# Patient Record
Sex: Male | Born: 1960 | Race: White | Hispanic: No | Marital: Married | Smoking: Current every day smoker
Health system: Southern US, Community
[De-identification: ages and names within clinical notes are randomized; demographics above are authoritative.]

## PROBLEM LIST (undated history)

## (undated) DIAGNOSIS — M549 Dorsalgia, unspecified: Secondary | ICD-10-CM

## (undated) DIAGNOSIS — B1920 Unspecified viral hepatitis C without hepatic coma: Secondary | ICD-10-CM

## (undated) DIAGNOSIS — H8109 Meniere's disease, unspecified ear: Secondary | ICD-10-CM

## (undated) HISTORY — DX: Unspecified viral hepatitis C without hepatic coma: B19.20

## (undated) HISTORY — PX: HERNIA REPAIR: SHX51

## (undated) HISTORY — PX: EYE SURGERY: SHX253

---

## 2010-12-13 ENCOUNTER — Inpatient Hospital Stay (INDEPENDENT_AMBULATORY_CARE_PROVIDER_SITE_OTHER)
Admission: RE | Admit: 2010-12-13 | Discharge: 2010-12-13 | Disposition: A | Discharge status: Home / Self Care | Payer: BC Managed Care – PPO | Source: Ambulatory Visit | Attending: Family Medicine | Admitting: Family Medicine

## 2010-12-13 DIAGNOSIS — H8109 Meniere's disease, unspecified ear: Secondary | ICD-10-CM

## 2012-07-31 ENCOUNTER — Encounter (HOSPITAL_COMMUNITY): Payer: Self-pay | Admitting: *Deleted

## 2012-07-31 ENCOUNTER — Emergency Department (HOSPITAL_COMMUNITY): Payer: BC Managed Care – PPO

## 2012-07-31 ENCOUNTER — Observation Stay (HOSPITAL_COMMUNITY)
Admission: EM | Admit: 2012-07-31 | Discharge: 2012-08-01 | Disposition: A | Discharge status: Home / Self Care | Payer: BC Managed Care – PPO | Attending: Internal Medicine | Admitting: Internal Medicine

## 2012-07-31 DIAGNOSIS — R05 Cough: Secondary | ICD-10-CM | POA: Insufficient documentation

## 2012-07-31 DIAGNOSIS — J3489 Other specified disorders of nose and nasal sinuses: Secondary | ICD-10-CM | POA: Insufficient documentation

## 2012-07-31 DIAGNOSIS — M549 Dorsalgia, unspecified: Secondary | ICD-10-CM | POA: Insufficient documentation

## 2012-07-31 DIAGNOSIS — H8109 Meniere's disease, unspecified ear: Secondary | ICD-10-CM | POA: Insufficient documentation

## 2012-07-31 DIAGNOSIS — R110 Nausea: Secondary | ICD-10-CM | POA: Insufficient documentation

## 2012-07-31 DIAGNOSIS — J441 Chronic obstructive pulmonary disease with (acute) exacerbation: Secondary | ICD-10-CM | POA: Insufficient documentation

## 2012-07-31 DIAGNOSIS — R059 Cough, unspecified: Secondary | ICD-10-CM | POA: Insufficient documentation

## 2012-07-31 DIAGNOSIS — R0789 Other chest pain: Principal | ICD-10-CM | POA: Insufficient documentation

## 2012-07-31 DIAGNOSIS — F17200 Nicotine dependence, unspecified, uncomplicated: Secondary | ICD-10-CM | POA: Insufficient documentation

## 2012-07-31 DIAGNOSIS — Z720 Tobacco use: Secondary | ICD-10-CM

## 2012-07-31 DIAGNOSIS — Z79899 Other long term (current) drug therapy: Secondary | ICD-10-CM | POA: Insufficient documentation

## 2012-07-31 DIAGNOSIS — R209 Unspecified disturbances of skin sensation: Secondary | ICD-10-CM | POA: Insufficient documentation

## 2012-07-31 HISTORY — DX: Dorsalgia, unspecified: M54.9

## 2012-07-31 HISTORY — DX: Meniere's disease, unspecified ear: H81.09

## 2012-07-31 LAB — CBC
Hemoglobin: 15.9 g/dL (ref 13.0–17.0)
MCH: 26.6 pg (ref 26.0–34.0)
MCHC: 33.7 g/dL (ref 30.0–36.0)
Platelets: 186 10*3/uL (ref 150–400)
Platelets: 179 10*3/uL (ref 150–400)
RBC: 5.98 MIL/uL — ABNORMAL HIGH (ref 4.22–5.81)
RBC: 5.63 MIL/uL (ref 4.22–5.81)
RDW: 13.3 % (ref 11.5–15.5)
WBC: 7.7 10*3/uL (ref 4.0–10.5)

## 2012-07-31 LAB — COMPREHENSIVE METABOLIC PANEL
ALT: 42 U/L (ref 0–53)
AST: 43 U/L — ABNORMAL HIGH (ref 0–37)
Alkaline Phosphatase: 61 U/L (ref 39–117)
CO2: 23 mEq/L (ref 19–32)
Calcium: 9.5 mg/dL (ref 8.4–10.5)
GFR calc Af Amer: >90 mL/min (ref >90)
Glucose, Bld: 155 mg/dL — ABNORMAL HIGH (ref 70–99)
Potassium: 3.4 mEq/L — ABNORMAL LOW (ref 3.5–5.1)
Sodium: 134 mEq/L — ABNORMAL LOW (ref 135–145)
Total Protein: 7.5 g/dL (ref 6.0–8.3)

## 2012-07-31 LAB — POCT I-STAT TROPONIN I

## 2012-07-31 LAB — HEPATIC FUNCTION PANEL
ALT: 36 U/L (ref 0–53)
AST: 35 U/L (ref 0–37)
Total Protein: 6.6 g/dL (ref 6.0–8.3)

## 2012-07-31 LAB — TROPONIN I: Troponin I: <0.3 ng/mL (ref <0.30)

## 2012-07-31 LAB — LIPID PANEL
LDL Cholesterol: 86 mg/dL (ref 0–99)
VLDL: 27 mg/dL (ref 0–40)

## 2012-07-31 LAB — PHOSPHORUS: Phosphorus: 2.9 mg/dL (ref 2.3–4.6)

## 2012-07-31 LAB — CK TOTAL AND CKMB (NOT AT ARMC)
CK, MB: 2 ng/mL (ref 0.3–4.0)
Relative Index: INVALID (ref 0.0–2.5)

## 2012-07-31 LAB — MAGNESIUM: Magnesium: 2.1 mg/dL (ref 1.5–2.5)

## 2012-07-31 MED ORDER — ESCITALOPRAM OXALATE 20 MG PO TABS
20.0000 mg | ORAL_TABLET | Freq: Every evening | ORAL | Status: DC
Start: 2012-07-31 — End: 2012-08-01
  Administered 2012-07-31: 20 mg via ORAL
  Filled 2012-07-31 (×2): qty 1

## 2012-07-31 MED ORDER — MORPHINE SULFATE 2 MG/ML IJ SOLN: 1.0000 mg | INTRAMUSCULAR | Status: DC | PRN

## 2012-07-31 MED ORDER — HYDROMORPHONE HCL PF 1 MG/ML IJ SOLN: 0.5000 mg | INTRAMUSCULAR | Status: AC | PRN

## 2012-07-31 MED ORDER — GI COCKTAIL ~~LOC~~
30.0000 mL | Freq: Once | ORAL | Status: AC
  Administered 2012-07-31: 30 mL via ORAL
  Filled 2012-07-31: qty 30

## 2012-07-31 MED ORDER — SODIUM CHLORIDE 0.9 % IV BOLUS (SEPSIS)
1000.0000 mL | Freq: Once | INTRAVENOUS | Status: AC
  Administered 2012-07-31: 1000 mL via INTRAVENOUS

## 2012-07-31 MED ORDER — SODIUM CHLORIDE 0.9 % IJ SOLN
3.0000 mL | Freq: Two times a day (BID) | INTRAMUSCULAR | Status: DC
  Administered 2012-07-31: 3 mL via INTRAVENOUS

## 2012-07-31 MED ORDER — SODIUM CHLORIDE 0.9 % IV SOLN: INTRAVENOUS | Status: DC

## 2012-07-31 MED ORDER — LEVOFLOXACIN 750 MG PO TABS
750.0000 mg | ORAL_TABLET | Freq: Every day | ORAL | Status: DC
  Administered 2012-07-31 – 2012-08-01 (×2): 750 mg via ORAL
  Filled 2012-07-31 (×2): qty 1

## 2012-07-31 MED ORDER — ALUM & MAG HYDROXIDE-SIMETH 200-200-20 MG/5ML PO SUSP: 30.0000 mL | Freq: Four times a day (QID) | ORAL | Status: DC | PRN

## 2012-07-31 MED ORDER — ACETAMINOPHEN 650 MG RE SUPP: 650.0000 mg | Freq: Four times a day (QID) | RECTAL | Status: DC | PRN

## 2012-07-31 MED ORDER — ENOXAPARIN SODIUM 40 MG/0.4ML ~~LOC~~ SOLN
40.0000 mg | SUBCUTANEOUS | Status: DC
  Administered 2012-07-31: 40 mg via SUBCUTANEOUS
  Filled 2012-07-31 (×2): qty 0.4

## 2012-07-31 MED ORDER — ASPIRIN EC 325 MG PO TBEC
325.0000 mg | DELAYED_RELEASE_TABLET | Freq: Every day | ORAL | Status: DC
  Administered 2012-07-31 – 2012-08-01 (×2): 325 mg via ORAL
  Filled 2012-07-31 (×2): qty 1

## 2012-07-31 MED ORDER — HYDROCODONE-ACETAMINOPHEN 5-325 MG PO TABS: 1.0000 | ORAL_TABLET | ORAL | Status: DC | PRN

## 2012-07-31 MED ORDER — ONDANSETRON HCL 4 MG/2ML IJ SOLN: 4.0000 mg | Freq: Four times a day (QID) | INTRAMUSCULAR | Status: DC | PRN

## 2012-07-31 MED ORDER — ONDANSETRON HCL 4 MG/2ML IJ SOLN: 4.0000 mg | Freq: Three times a day (TID) | INTRAMUSCULAR | Status: DC | PRN

## 2012-07-31 MED ORDER — ONDANSETRON HCL 4 MG PO TABS: 4.0000 mg | ORAL_TABLET | Freq: Four times a day (QID) | ORAL | Status: DC | PRN

## 2012-07-31 MED ORDER — SENNA 8.6 MG PO TABS
1.0000 | ORAL_TABLET | Freq: Two times a day (BID) | ORAL | Status: DC
  Administered 2012-07-31 (×2): 8.6 mg via ORAL
  Filled 2012-07-31: qty 2
  Filled 2012-07-31 (×2): qty 1

## 2012-07-31 MED ORDER — ASPIRIN 81 MG PO CHEW
324.0000 mg | CHEWABLE_TABLET | Freq: Once | ORAL | Status: AC
  Administered 2012-07-31: 162 mg via ORAL
  Filled 2012-07-31: qty 2

## 2012-07-31 MED ORDER — IPRATROPIUM BROMIDE 0.02 % IN SOLN: 0.5000 mg | RESPIRATORY_TRACT | Status: DC | PRN

## 2012-07-31 MED ORDER — CLONAZEPAM 0.5 MG PO TABS: 0.5000 mg | ORAL_TABLET | Freq: Three times a day (TID) | ORAL | Status: DC | PRN

## 2012-07-31 MED ORDER — ALBUTEROL SULFATE (5 MG/ML) 0.5% IN NEBU: 2.5000 mg | INHALATION_SOLUTION | RESPIRATORY_TRACT | Status: DC | PRN

## 2012-07-31 MED ORDER — BISACODYL 5 MG PO TBEC: 5.0000 mg | DELAYED_RELEASE_TABLET | Freq: Every day | ORAL | Status: DC | PRN

## 2012-07-31 MED ORDER — NITROGLYCERIN 0.4 MG SL SUBL: 0.4000 mg | SUBLINGUAL_TABLET | SUBLINGUAL | Status: DC | PRN

## 2012-07-31 MED ORDER — ACETAMINOPHEN 325 MG PO TABS: 650.0000 mg | ORAL_TABLET | Freq: Four times a day (QID) | ORAL | Status: DC | PRN

## 2012-07-31 NOTE — ED Notes (Signed)
Pt reports epigastric pain since this am, describes as pressure. Reports diaphoresis, dizziness, sob. Denies n/v. Cough/sick since Tuesday.

## 2012-07-31 NOTE — H&P (Signed)
[facility] History and Physical  Andrew Campos YNW:[id number] DOB: 04-28-1961 DOA: 07/31/2012  Referring physician: Jeraldine Loots PCP: No primary provider on file.   Chief Complaint: chest pain/bilateral hand numbness  HPI: Ishaq Maffei is a 51 y.o. male  51 yo hx chronic back pain, meneires's disease presents to ED cc chest pain, bilateral hand and feet numbness. Reports having "flu-like" symptoms earlier in week and did not work . During that time reports nasal/sinus congestion, cough, nausea no vomiting, no diarrhea. Also decreased po intake. On way to work this am hands suddenly became numb and developed CP. Describes pain as pressure and points to epigastric area. Denies sob, diaphoresis. Denied radiation of the pain.  Reports pain constant until he received meds in ED. He got dilaudid and GI cocktail and zofran. Rates pain 9/10 at worst, nothing made better, nothing made worse. Denies visual disturbances, dizziness. Pt with hx smoking and family hx CAD with MI. Pt is smoker. [facility] asked to admit for CP rule out.   Review of Systems: The patient denies  fever, weight loss,, vision loss, decreased hearing, hoarseness, syncope, dyspnea on exertion, peripheral edema, balance deficits, hemoptysis, abdominal pain, melena, hematochezia, severe indigestion/heartburn, hematuria, incontinence, genital sores, muscle weakness, suspicious skin lesions, transient blindness, difficulty walking,  unusual weight change, abnormal bleeding, enlarged lymph nodes, angioedema, and breast masses.    Past Medical History  Diagnosis Date  . Meniere's disease   . Back pain    Past Surgical History  Procedure Date  . Hernia repair   . Eye surgery    Social History:  reports that he has been smoking.  He has never used smokeless tobacco. He reports that he drinks alcohol. He reports that he does not use illicit drugs. Pt lives with spouse. Is independent with ADL's  Allergies  Allergen Reactions   . Penicillins Hives    History reviewed. No pertinent family history. Grandfather died of MI. Father alive with CAD and 1 MI. One brother with schizophrenia other healthy. Unsure of mothers medical hx   Prior to Admission medications   Medication Sig Start Date End Date Taking? Authorizing Provider  clonazePAM (KLONOPIN) 0.5 MG tablet Take 0.5 mg by mouth 4 (four) times daily as needed. For anxiety   Yes Historical Provider, MD  escitalopram (LEXAPRO) 20 MG tablet Take 20 mg by mouth every evening.   Yes Historical Provider, MD   Physical Exam: Filed Vitals:   07/31/12 0838  BP: 126/77  Pulse: 68  Temp: 97.9 F (36.6 C)  TempSrc: Oral  Resp: 20  SpO2: 100%     General:  Awake, alert, NAD  Eyes: PERRL, EOMI  ENT: mucus membranes pink/moist, ears clear, no drainage from nose.   Neck: supple, full rom, no JVD  Cardiovascular: RRR no MGR, No LEE  Respiratory: normal effort, BSCTAB No wheeze, rhonchi dry cough  Abdomen: soft, +BS mild tenderness in upper quadrants. No mass/organmegaly  Skin: warm/dry, no rash/lesion  Musculoskeletal: MOE no Joint swelling/erythema  Psychiatric: appropriate, cooperative  Neurologic: cranial nerve II-XII intact. Sensation intact UE bilaterally   Labs on Admission:  Basic Metabolic Panel:  Lab 07/31/12 6578  NA 134*  K 3.4*  CL 99  CO2 23  GLUCOSE 155*  BUN 12  CREATININE 0.80  CALCIUM 9.5  MG --  PHOS --   Liver Function Tests:  Lab 07/31/12 0900  AST 43*  ALT 42  ALKPHOS 61  BILITOT 0.9  PROT 7.5  ALBUMIN 4.0  Lab 07/31/12 0900  LIPASE 23  AMYLASE --   No results found for this basename: AMMONIA:5 in the last 168 hours CBC:  Lab 07/31/12 0900  WBC 9.5  NEUTROABS --  HGB 15.9  HCT 47.2  MCV 78.9  PLT 186   Cardiac Enzymes: No results found for this basename: CKTOTAL:5,CKMB:5,CKMBINDEX:5,TROPONINI:5 in the last 168 hours  BNP (last 3 results) No results found for this basename: PROBNP:3 in the last  8760 hours CBG: No results found for this basename: GLUCAP:5 in the last 168 hours  Radiological Exams on Admission: Dg Chest Port 1 View  07/31/2012  *RADIOLOGY REPORT*  Clinical Data: Chest pain and pressure, numbness and tingling in both upper extremities, history smoking  PORTABLE CHEST - 1 VIEW  Comparison: Portable exam 0854 hours without priors for comparison.  Findings: Normal heart size, mediastinal contours, and pulmonary vascularity. Lungs clear. No pleural effusion or pneumothorax.  IMPRESSION: No acute abnormalities.   Original Report Authenticated By: Lollie Marrow, M.D.     EKG: Independently reviewed. NSR  Assessment/Plan Principal Problem:  *Chest pain, atypical: given smoking hx and family hx and pt never had eval will admit to tele for rule out. Currently much improved.  Will cycle CE, will check FLP, liver panel, TSH. Will repeat EKG in am. Will provide NTG and Morphine as needed for CP. Will provide anti-emetics as needed. Test results and clinical course to determine if needs stress test and if needs to be inpt vs OP.  Active Problems:  constipation: pt reports no BM in several days. Will provide dulcolax Hypokalemia: mild, likely related to decreased po intake. Will replete. Provide gentle IV hydration Anxiety: at baseline will continue home meds  Code Status: full Family Communication: pt at bedside Disposition Plan: home when medically stable.   Time spent: 45 minutes  Gwenyth Bender  NP [facility]   If 7PM-7AM, please contact night-coverage [email or web address] Password [email or web address] 07/31/2012, 11:35 AM

## 2012-07-31 NOTE — Progress Notes (Signed)
EPIC reviewed (observation) Spoke with K Black NP for [facility] and Dr Benjamine Mola Agreed to Observation Order given Updated admission status in [facility]

## 2012-07-31 NOTE — Progress Notes (Signed)
Andrew Campos, is a 51 y.o. male,   MRN: [id number]  -  DOB - 04-07-1961  Outpatient Primary MD for the patient is No primary provider on file.  in for    Chief Complaint  Patient presents with  . Chest Pain     Blood pressure 126/77, pulse 68, temperature 97.9 F (36.6 C), temperature source Oral, resp. rate 20, SpO2 100.00%.  Principal Problem:  *Chest pain, atypical Active Problems:  Back pain  51 yo hx chronic back pain, meneires's disease presents to ED cc chest pain, bilateral hand and feet numbness. Reports having "flu-like" symptoms earlier in week and did not work . During that time reports nasal/sinus congestion, cough, nausea no vomiting, no diarrhea. Also decreased po intake. On way to work this am hands suddenly became numb and developed CP. Describes pain as pressure and points to epigastric area. Denies sob, diaphoresis. Reports pain constant until he received meds in ED. He got dilaudid and GI cocktail and zofran.   Work up yields sodium 134, pot 3.4 trop 0.00. Other labs unremarkable. Chest xray without abnormalities EKG NSR.   On exam VSS, looks like he does not feel well but non-toxic. Epigastric area tender to palp.  He is a smoker. Father and grandfather CAD with MI's. No hx of cardiac eval.   Will admit to tele.

## 2012-07-31 NOTE — ED Provider Notes (Signed)
History     CSN: [id number]  Arrival date & time 07/31/12  4098   First MD Initiated Contact with Patient 07/31/12 [id number]      Chief Complaint  Patient presents with  . Chest Pain     HPI Patient presents with 5 days of complaints.  He notes that prior to the onset of symptoms she was in his usual state of health.  About that time he gradually developed diffuse generalized discomfort with cough, congestion, anorexia and nausea.  He notes over the past day he has additionally developed pain in his epigastrium, sore.  He notes that since onset of symptoms it had no new bowel movements, which is atypical. He denies significant dyspnea, or any exertional or pleuritic worsening of his symptoms. He notes mild lightheadedness without syncope.  No clear alleviating or exacerbating factors History reviewed. No pertinent past medical history.  Past Surgical History  Procedure Date  . Hernia repair   . Eye surgery     No family history on file.  History  Substance Use Topics  . Smoking status: Current Every Day Smoker  . Smokeless tobacco: Not on file  . Alcohol Use: Yes     rarely      Review of Systems  Constitutional:       Per HPI, otherwise negative  HENT:       Per HPI, otherwise negative  Eyes: Negative.   Respiratory:       Per HPI, otherwise negative  Cardiovascular:       Per HPI, otherwise negative  Gastrointestinal: Negative for vomiting.  Genitourinary: Negative.   Musculoskeletal:       Per HPI, otherwise negative  Skin: Negative.   Neurological: Negative for syncope.    Allergies  Penicillins  Home Medications  No current outpatient prescriptions on file.  BP 126/77  Pulse 68  Temp 97.9 F (36.6 C) (Oral)  Resp 20  SpO2 100%  Physical Exam  Nursing note and vitals reviewed. Constitutional: He is oriented to person, place, and time. He appears well-developed. No distress.  HENT:  Head: Normocephalic and atraumatic.  Eyes: Conjunctivae normal  and EOM are normal.  Cardiovascular: Normal rate and regular rhythm.   Pulmonary/Chest: Effort normal. No stridor. No respiratory distress.  Abdominal: Soft. He exhibits no distension. There is tenderness in the epigastric area. There is guarding. There is no rigidity and no rebound.  Musculoskeletal: He exhibits no edema.  Neurological: He is alert and oriented to person, place, and time.  Skin: Skin is warm and dry.  Psychiatric: He has a normal mood and affect.    ED Course  Procedures (including critical care time)   Labs Reviewed  CBC  COMPREHENSIVE METABOLIC PANEL  LIPASE, BLOOD   Dg Chest Port 1 View  07/31/2012  *RADIOLOGY REPORT*  Clinical Data: Chest pain and pressure, numbness and tingling in both upper extremities, history smoking  PORTABLE CHEST - 1 VIEW  Comparison: Portable exam 0854 hours without priors for comparison.  Findings: Normal heart size, mediastinal contours, and pulmonary vascularity. Lungs clear. No pleural effusion or pneumothorax.  IMPRESSION: No acute abnormalities.   Original Report Authenticated By: Lollie Marrow, M.D.      No diagnosis found.  Cardiac: 65sr, normal Pulse ox 99%ra, normal   Date: 07/31/2012  Rate: 75  Rhythm: normal sinus rhythm  QRS Axis: normal  Intervals: normal  ST/T Wave abnormalities: normal  Conduction Disutrbances: none  Narrative Interpretation: unremarkable    10:33 AM  Patient notes resolution of his epigastric/cp.    MDM  This male presents with generalized complaints any new epigastric, infrasternal pain.  On exam he is in no distress with unremarkable vitals, but initially looks unwell.  The patient had pain relief in the emergency department.  However, given his risk factors, the absence of prior cardiac evaluation, the patient was admitted for further evaluation and management of his chest pain  Gerhard Munch, MD 07/31/12 1034

## 2012-07-31 NOTE — ED Notes (Signed)
Pt states started having "flu like" symptoms Tuesday, did not go to work Wednesday and Thursday, then tried to go to work this morning when he started having pressure mid-sternal chest pain, shortness of breath, tingling in hands and feet. Pt states having chest and head congestion but denies coughing anything up. Pt denies n/v/d. Pt states "I just feel like I'm not getting enough oxygen". Pt placed on 2L [place], on RA was 100%. Pt seems to be tired, when asked he states "a little bit". Denies blurred vision.

## 2012-07-31 NOTE — H&P (Signed)
Patient seen and examined by me.  Agree with plan by Toya Smothers.  Patient c/o cough and cold like symptoms.  Will add breathing treatments and abx to regimen as this seems like a COPD exacerbation.  Plan to cycle CE.  And leave on tele  Marlin Canary DO

## 2012-07-31 NOTE — ED Notes (Signed)
RN to obtain labs with start of IV 

## 2012-08-01 LAB — TROPONIN I: Troponin I: <0.3 ng/mL (ref <0.30)

## 2012-08-01 LAB — CK TOTAL AND CKMB (NOT AT ARMC)
CK, MB: 2.6 ng/mL (ref 0.3–4.0)
Relative Index: 2.1 (ref 0.0–2.5)
Total CK: 123 U/L (ref 7–232)

## 2012-08-01 LAB — TSH: TSH: 2.511 u[IU]/mL (ref 0.350–4.500)

## 2012-08-01 MED ORDER — LEVOFLOXACIN 750 MG PO TABS: 750.0000 mg | ORAL_TABLET | Freq: Every day | ORAL | Status: DC

## 2012-08-01 MED ORDER — ASPIRIN 325 MG PO TBEC: 325.0000 mg | DELAYED_RELEASE_TABLET | Freq: Every day | ORAL | Status: DC

## 2012-08-01 NOTE — Discharge Summary (Signed)
Physician Discharge Summary  Andrew Campos ZOX:[id number] DOB: 1961/10/26 DOA: 07/31/2012  PCP: No primary provider on file.  Admit date: 07/31/2012 Discharge date: 08/01/2012  Recommendations for Outpatient Follow-up:  1. Tobacco cessation 2. Outpatient stress test  Discharge Diagnoses:  Principal Problem:  *Chest pain, atypical Active Problems:  Back pain  COPD exacerbation  Tobacco abuse   Discharge Condition: improved  Diet recommendation: cardiac  Filed Weights   07/31/12 1215  Weight: 68.04 kg (150 lb)    History of present illness:  Andrew Campos is a 51 y.o. male  51 yo hx chronic back pain, meneires's disease presents to ED cc chest pain, bilateral hand and feet numbness. Reports having "flu-like" symptoms earlier in week and did not work . During that time reports nasal/sinus congestion, cough, nausea no vomiting, no diarrhea. Also decreased po intake. On way to work this am hands suddenly became numb and developed CP. Describes pain as pressure and points to epigastric area. Denies sob, diaphoresis. Denied radiation of the pain. Reports pain constant until he received meds in ED. He got dilaudid and GI cocktail and zofran. Rates pain 9/10 at worst, nothing made better, nothing made worse. Denies visual disturbances, dizziness.  Pt with hx smoking and family hx CAD with MI. Pt is smoker. [facility] asked to admit for CP rule out.   Hospital Course:  Admitted for CP r/o.  Appeared to be a viral/cOPD exacerbation,.  CE negative, tele ok.  Patient needs to stop smoking and go to PCP for outpatient stress test.   Discharge Exam: Filed Vitals:   07/31/12 0838 07/31/12 1215 07/31/12 2136 08/01/12 0500  BP: 126/77 108/73 105/65 125/77  Pulse: 68 54 64 78  Temp: 97.9 F (36.6 C) 98.1 F (36.7 C) 98 F (36.7 C) 97.8 F (36.6 C)  TempSrc: Oral Oral Oral Oral  Resp: 20 16 16 20   Height:  5\' 8"  (1.727 m)    Weight:  68.04 kg (150 lb)    SpO2: 100% 99% 94% 95%     General: A+Ox3, NAD- feeling better Cardiovascular: rrr Respiratory: clear anterior, no wheezing  Discharge Instructions      Discharge Orders    Future Orders Please Complete By Expires   Diet - low sodium heart healthy      Increase activity slowly      Discharge instructions      Comments:   Follow up with PCP, would recommend stopped smoking and outpatient stress test       Medication List     As of 08/01/2012  1:55 PM    TAKE these medications         aspirin 325 MG EC tablet   Take 1 tablet (325 mg total) by mouth daily.      clonazePAM 0.5 MG tablet   Commonly known as: KLONOPIN   Take 0.5 mg by mouth 4 (four) times daily as needed. For anxiety      escitalopram 20 MG tablet   Commonly known as: LEXAPRO   Take 20 mg by mouth every evening.      levofloxacin 750 MG tablet   Commonly known as: LEVAQUIN   Take 1 tablet (750 mg total) by mouth daily.            The results of significant diagnostics from this hospitalization (including imaging, microbiology, ancillary and laboratory) are listed below for reference.    Significant Diagnostic Studies: Dg Chest Port 1 View  07/31/2012  *RADIOLOGY REPORT*  Clinical Data: Chest pain and pressure, numbness and tingling in both upper extremities, history smoking  PORTABLE CHEST - 1 VIEW  Comparison: Portable exam 0854 hours without priors for comparison.  Findings: Normal heart size, mediastinal contours, and pulmonary vascularity. Lungs clear. No pleural effusion or pneumothorax.  IMPRESSION: No acute abnormalities.   Original Report Authenticated By: Lollie Marrow, M.D.     Microbiology: No results found for this or any previous visit (from the past 240 hour(s)).   Labs: Basic Metabolic Panel:  Lab 07/31/12 1610 07/31/12 0900  NA -- 134*  K -- 3.4*  CL -- 99  CO2 -- 23  GLUCOSE -- 155*  BUN -- 12  CREATININE -- 0.80  CALCIUM 8.8 9.5  MG 2.1 --  PHOS 2.9 --   Liver Function Tests:  Lab  07/31/12 1355 07/31/12 0900  AST 35 43*  ALT 36 42  ALKPHOS 55 61  BILITOT 0.8 0.9  PROT 6.6 7.5  ALBUMIN 3.6 4.0    Lab 07/31/12 0900  LIPASE 23  AMYLASE --   No results found for this basename: AMMONIA:5 in the last 168 hours CBC:  Lab 07/31/12 1355 07/31/12 0900  WBC 7.7 9.5  NEUTROABS -- --  HGB 15.1 15.9  HCT 45.0 47.2  MCV 79.9 78.9  PLT 179 186   Cardiac Enzymes:  Lab 08/01/12 0515 07/31/12 2025 07/31/12 1355  CKTOTAL 123 110 84  CKMB 2.6 2.4 2.0  CKMBINDEX -- -- --  TROPONINI <0.30 <0.30 <0.30   BNP: BNP (last 3 results) No results found for this basename: PROBNP:3 in the last 8760 hours CBG: No results found for this basename: GLUCAP:5 in the last 168 hours  Time coordinating discharge: 35 minutes  Signed:  Marlin Canary  [facility] 08/01/2012, 1:55 PM

## 2012-08-01 NOTE — Progress Notes (Addendum)
Patient discharged home with wife, discharge instructions given and explained to patient and he verbalized understanding. Skin intact, no wound. Patient denies any pain/distress. Accompanied home by brother.

## 2012-08-01 NOTE — Progress Notes (Signed)
Physician Discharge Summary  Andrew Campos WUJ:[id number] DOB: 02-Aug-1961 DOA: 07/31/2012  PCP: No primary provider on file.  Admit date: 07/31/2012 Discharge date: 08/01/2012  Recommendations for Outpatient Follow-up:  1. Tobacco cessation 2. Outpatient stress test  Discharge Diagnoses:  Principal Problem:  *Chest pain, atypical Active Problems:  Back pain  COPD exacerbation  Tobacco abuse   Discharge Condition: improved  Diet recommendation: cardiac  Filed Weights   07/31/12 1215  Weight: 68.04 kg (150 lb)    History of present illness:  Andrew Campos is a 51 y.o. male  51 yo hx chronic back pain, meneires's disease presents to ED cc chest pain, bilateral hand and feet numbness. Reports having "flu-like" symptoms earlier in week and did not work . During that time reports nasal/sinus congestion, cough, nausea no vomiting, no diarrhea. Also decreased po intake. On way to work this am hands suddenly became numb and developed CP. Describes pain as pressure and points to epigastric area. Denies sob, diaphoresis. Denied radiation of the pain. Reports pain constant until he received meds in ED. He got dilaudid and GI cocktail and zofran. Rates pain 9/10 at worst, nothing made better, nothing made worse. Denies visual disturbances, dizziness.  Pt with hx smoking and family hx CAD with MI. Pt is smoker. [facility] asked to admit for CP rule out.   Hospital Course:  Admitted for CP r/o.  Appeared to be a viral/cOPD exacerbation,.  CE negative, tele ok.  Patient needs to stop smoking and go to PCP for outpatient stress test.   Discharge Exam: Filed Vitals:   07/31/12 0838 07/31/12 1215 07/31/12 2136 08/01/12 0500  BP: 126/77 108/73 105/65 125/77  Pulse: 68 54 64 78  Temp: 97.9 F (36.6 C) 98.1 F (36.7 C) 98 F (36.7 C) 97.8 F (36.6 C)  TempSrc: Oral Oral Oral Oral  Resp: 20 16 16 20   Height:  5\' 8"  (1.727 m)    Weight:  68.04 kg (150 lb)    SpO2: 100% 99% 94% 95%     General: A+Ox3, NAD- feeling better Cardiovascular: rrr Respiratory: clear anterior, no wheezing  Discharge Instructions  Discharge Orders    Future Orders Please Complete By Expires   Diet - low sodium heart healthy      Increase activity slowly      Discharge instructions      Comments:   Follow up with PCP, would recommend stopped smoking and outpatient stress test       Medication List     As of 08/01/2012  9:07 AM    TAKE these medications         aspirin 325 MG EC tablet   Take 1 tablet (325 mg total) by mouth daily.      clonazePAM 0.5 MG tablet   Commonly known as: KLONOPIN   Take 0.5 mg by mouth 4 (four) times daily as needed. For anxiety      escitalopram 20 MG tablet   Commonly known as: LEXAPRO   Take 20 mg by mouth every evening.      levofloxacin 750 MG tablet   Commonly known as: LEVAQUIN   Take 1 tablet (750 mg total) by mouth daily.          The results of significant diagnostics from this hospitalization (including imaging, microbiology, ancillary and laboratory) are listed below for reference.    Significant Diagnostic Studies: Dg Chest Port 1 View  07/31/2012  *RADIOLOGY REPORT*  Clinical Data: Chest pain and pressure,  numbness and tingling in both upper extremities, history smoking  PORTABLE CHEST - 1 VIEW  Comparison: Portable exam 0854 hours without priors for comparison.  Findings: Normal heart size, mediastinal contours, and pulmonary vascularity. Lungs clear. No pleural effusion or pneumothorax.  IMPRESSION: No acute abnormalities.   Original Report Authenticated By: Lollie Marrow, M.D.     Microbiology: No results found for this or any previous visit (from the past 240 hour(s)).   Labs: Basic Metabolic Panel:  Lab 07/31/12 1610 07/31/12 0900  NA -- 134*  K -- 3.4*  CL -- 99  CO2 -- 23  GLUCOSE -- 155*  BUN -- 12  CREATININE -- 0.80  CALCIUM 8.8 9.5  MG 2.1 --  PHOS 2.9 --   Liver Function Tests:  Lab 07/31/12 1355  07/31/12 0900  AST 35 43*  ALT 36 42  ALKPHOS 55 61  BILITOT 0.8 0.9  PROT 6.6 7.5  ALBUMIN 3.6 4.0    Lab 07/31/12 0900  LIPASE 23  AMYLASE --   No results found for this basename: AMMONIA:5 in the last 168 hours CBC:  Lab 07/31/12 1355 07/31/12 0900  WBC 7.7 9.5  NEUTROABS -- --  HGB 15.1 15.9  HCT 45.0 47.2  MCV 79.9 78.9  PLT 179 186   Cardiac Enzymes:  Lab 08/01/12 0515 07/31/12 2025 07/31/12 1355  CKTOTAL 123 110 84  CKMB 2.6 2.4 2.0  CKMBINDEX -- -- --  TROPONINI <0.30 <0.30 <0.30   BNP: BNP (last 3 results) No results found for this basename: PROBNP:3 in the last 8760 hours CBG: No results found for this basename: GLUCAP:5 in the last 168 hours  Time coordinating discharge: 35 minutes  Signed:  Marlin Canary  [facility] 08/01/2012, 9:07 AM

## 2013-09-02 ENCOUNTER — Ambulatory Visit (INDEPENDENT_AMBULATORY_CARE_PROVIDER_SITE_OTHER): Payer: BC Managed Care – PPO | Admitting: Family Medicine

## 2013-09-02 ENCOUNTER — Encounter: Payer: Self-pay | Admitting: Family Medicine

## 2013-09-02 VITALS — BP 100/70 | HR 78 | Temp 97.2°F | Resp 20 | Wt 140.0 lb

## 2013-09-02 DIAGNOSIS — B182 Chronic viral hepatitis C: Secondary | ICD-10-CM

## 2013-09-02 DIAGNOSIS — B1920 Unspecified viral hepatitis C without hepatic coma: Secondary | ICD-10-CM | POA: Insufficient documentation

## 2013-09-02 DIAGNOSIS — Z125 Encounter for screening for malignant neoplasm of prostate: Secondary | ICD-10-CM

## 2013-09-02 NOTE — Progress Notes (Signed)
  Subjective:    Patient ID: Andrew Campos, male    DOB: 03/31/1961, 52 y.o.   MRN: [id number]  HPI Patient is here today requesting a prostate exam. He is over 52 years of age and realizes this needs to be done. He has a history of an abnormal prostate exam in the past. He denies any polyuria, polydipsia, urinary hesitancy, or dysuria.  He does have a history of hepatitis C with a 1A genotype. He never followed up with hepatitis C clinic I recommended. He is now requesting a referral. Past Medical History  Diagnosis Date  . Meniere's disease   . Back pain   . Hepatitis C     Hep C 1a genotype   Current Outpatient Prescriptions on File Prior to Visit  Medication Sig Dispense Refill  . aspirin EC 325 MG EC tablet Take 1 tablet (325 mg total) by mouth daily.  30 tablet    . clonazePAM (KLONOPIN) 0.5 MG tablet Take 0.5 mg by mouth 4 (four) times daily as needed. For anxiety      . escitalopram (LEXAPRO) 20 MG tablet Take 20 mg by mouth every evening.       No current facility-administered medications on file prior to visit.   Allergies  Allergen Reactions  . Penicillins Hives   History   Social History  . Marital Status: Married    Spouse Name: N/A    Number of Children: N/A  . Years of Education: N/A   Occupational History  . Not on file.   Social History Main Topics  . Smoking status: Current Every Day Smoker  . Smokeless tobacco: Never Used  . Alcohol Use: Yes     Comment: rarely  . Drug Use: No  . Sexual Activity: No   Other Topics Concern  . Not on file   Social History Narrative  . No narrative on file      Review of Systems  All other systems reviewed and are negative.       Objective:   Physical Exam  Vitals reviewed. Cardiovascular: Normal rate, regular rhythm, normal heart sounds and intact distal pulses.   No murmur heard. Pulmonary/Chest: Effort normal and breath sounds normal. No respiratory distress. He has no wheezes. He has no rales. He  exhibits no tenderness.  Abdominal: Soft. Bowel sounds are normal. He exhibits no distension. There is no tenderness. There is no rebound and no guarding.  Genitourinary: Prostate is not enlarged.   there is a slight ridge on the upper right lobe of the prostate.        Assessment & Plan:  1. Prostate cancer screening Prostate exam is abnormal I will also check a PSA. - PSA  2. Hep C w/o coma, chronic I will refer patient to the hepatitis C clinic again. - Ambulatory referral to Gastroenterology

## 2013-09-03 LAB — PSA: PSA: 0.78 ng/mL

## 2014-05-09 ENCOUNTER — Encounter: Payer: Self-pay | Admitting: Family Medicine

## 2014-05-09 ENCOUNTER — Encounter: Payer: Self-pay | Admitting: Physician Assistant

## 2014-05-09 ENCOUNTER — Ambulatory Visit (INDEPENDENT_AMBULATORY_CARE_PROVIDER_SITE_OTHER): Payer: BC Managed Care – PPO | Admitting: Physician Assistant

## 2014-05-09 ENCOUNTER — Ambulatory Visit
Admission: RE | Admit: 2014-05-09 | Discharge: 2014-05-09 | Disposition: A | Discharge status: Home / Self Care | Payer: BC Managed Care – PPO | Source: Ambulatory Visit | Attending: Physician Assistant | Admitting: Physician Assistant

## 2014-05-09 VITALS — BP 144/100 | HR 76 | Temp 98.0°F | Resp 18 | Wt 150.0 lb

## 2014-05-09 DIAGNOSIS — M542 Cervicalgia: Secondary | ICD-10-CM

## 2014-05-09 DIAGNOSIS — M549 Dorsalgia, unspecified: Secondary | ICD-10-CM

## 2014-05-09 DIAGNOSIS — M25532 Pain in left wrist: Secondary | ICD-10-CM

## 2014-05-09 DIAGNOSIS — M25539 Pain in unspecified wrist: Secondary | ICD-10-CM

## 2014-05-09 MED ORDER — METAXALONE 800 MG PO TABS: 800.0000 mg | ORAL_TABLET | Freq: Four times a day (QID) | ORAL | Status: DC

## 2014-05-09 MED ORDER — HYDROCODONE-ACETAMINOPHEN 5-325 MG PO TABS: 1.0000 | ORAL_TABLET | Freq: Four times a day (QID) | ORAL | Status: DC | PRN

## 2014-05-09 MED ORDER — TRAMADOL HCL 50 MG PO TABS: ORAL_TABLET | ORAL | Status: DC
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
## 2014-05-19 ENCOUNTER — Encounter: Payer: Self-pay | Admitting: Physician Assistant

## 2014-05-19 ENCOUNTER — Ambulatory Visit (INDEPENDENT_AMBULATORY_CARE_PROVIDER_SITE_OTHER): Payer: BC Managed Care – PPO | Admitting: Physician Assistant

## 2014-05-19 ENCOUNTER — Encounter: Payer: Self-pay | Admitting: *Deleted

## 2014-05-19 VITALS — BP 122/68 | HR 72 | Temp 97.9°F | Resp 12 | Ht 67.0 in | Wt 151.0 lb

## 2014-05-19 DIAGNOSIS — M25532 Pain in left wrist: Secondary | ICD-10-CM

## 2014-05-19 DIAGNOSIS — M25539 Pain in unspecified wrist: Secondary | ICD-10-CM

## 2014-05-19 DIAGNOSIS — M542 Cervicalgia: Secondary | ICD-10-CM
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
## 2014-12-26 ENCOUNTER — Ambulatory Visit: Payer: Self-pay | Admitting: Family Medicine

## 2014-12-29 ENCOUNTER — Encounter: Payer: Self-pay | Admitting: Family Medicine

## 2014-12-29 ENCOUNTER — Ambulatory Visit (INDEPENDENT_AMBULATORY_CARE_PROVIDER_SITE_OTHER): Payer: BC Managed Care – PPO | Admitting: Family Medicine

## 2014-12-29 VITALS — BP 98/64 | HR 68 | Temp 97.9°F | Resp 16 | Ht 68.0 in | Wt 152.0 lb

## 2014-12-29 DIAGNOSIS — G8929 Other chronic pain: Secondary | ICD-10-CM

## 2014-12-29 DIAGNOSIS — M542 Cervicalgia: Secondary | ICD-10-CM

## 2014-12-29 MED ORDER — MELOXICAM 15 MG PO TABS: 15.0000 mg | ORAL_TABLET | Freq: Every day | ORAL | Status: DC
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
## 2015-01-06 ENCOUNTER — Ambulatory Visit: Payer: BC Managed Care – PPO | Admitting: Physical Therapy

## 2015-01-11 ENCOUNTER — Ambulatory Visit (INDEPENDENT_AMBULATORY_CARE_PROVIDER_SITE_OTHER): Payer: BC Managed Care – PPO | Admitting: Physical Therapy

## 2015-01-11 DIAGNOSIS — M436 Torticollis: Secondary | ICD-10-CM

## 2015-01-11 DIAGNOSIS — M542 Cervicalgia: Secondary | ICD-10-CM | POA: Diagnosis not present
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
## 2015-01-11 NOTE — Patient Instructions (Signed)
Recommend patient continue with the stretches he is currently performing however have upright posture while doing them.  Discussed patient working on lifting chest and not slouching during the day while keeping head over the shoulders.

## 2015-01-19 ENCOUNTER — Ambulatory Visit (INDEPENDENT_AMBULATORY_CARE_PROVIDER_SITE_OTHER): Payer: BC Managed Care – PPO | Admitting: Physical Therapy

## 2015-01-19 DIAGNOSIS — M542 Cervicalgia: Secondary | ICD-10-CM | POA: Diagnosis not present

## 2015-01-19 DIAGNOSIS — M436 Torticollis: Secondary | ICD-10-CM

## 2015-01-19 NOTE — Patient Instructions (Signed)
*   Place towel rolled close to neck and anchor shoulder and stretch away from towel.   Hold 30 sec, 2 reps, 2 times per day.   * Shoulder presses and shoulder shrugs.  Squeeze shoulder blades together - hold 5 sec. Repeat 8-10 times, 1 time per day.   * Heating pad to neck - 15 min as needed.   * Neck diagonals: elbows on floor or knees.  Look towards armpits, then look opposite direction.  X 5 reps each way.   * posture.  Avoid Left side bending (prolonged)   [facility] [address] [place], [place] [address]  [phone] (office) [phone] (fax)
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
## 2015-01-26 ENCOUNTER — Ambulatory Visit (INDEPENDENT_AMBULATORY_CARE_PROVIDER_SITE_OTHER): Payer: BC Managed Care – PPO | Admitting: Physical Therapy

## 2015-01-26 DIAGNOSIS — M542 Cervicalgia: Secondary | ICD-10-CM

## 2015-01-26 DIAGNOSIS — M436 Torticollis: Secondary | ICD-10-CM | POA: Diagnosis not present

## 2015-01-26 NOTE — Therapy (Addendum)
[facility] [place] [address] [place] [address] [place], [place], [address] Phone: [phone]   Fax:  [phone]  Physical Therapy Treatment  Patient Details  Name: Andrew Campos MRN: [id number] Date of Birth: 09/16/1961 Referring Provider:  Donita Brooks, MD  Encounter Date: 01/26/2015      PT End of Session - 01/26/15 1530    Visit Number 3   Number of Visits 6   Date for PT Re-Evaluation 02/22/15   PT Start Time 1530   PT Stop Time 1617   PT Time Calculation (min) 47 min   Activity Tolerance Patient tolerated treatment well      Past Medical History  Diagnosis Date  . Meniere's disease   . Back pain   . Hepatitis C     Hep C 1a genotype    Past Surgical History  Procedure Laterality Date  . Hernia repair    . Eye surgery      There were no vitals filed for this visit.  Visit Diagnosis:  Stiffness of neck  Pain, neck      Subjective Assessment - 01/26/15 1531    Symptoms Pt reports feeling a little worse than last week. Was doing a lot of heavy lifting for project at home. Trying to stay aware of posture.    Currently in Pain? Yes   Pain Score 3    Pain Location Neck   Pain Orientation Left   Pain Descriptors / Indicators Aching;Dull   Aggravating Factors  Overhead work, lifting heavy objects.    Pain Relieving Factors rest, heat.             [facility] PT Assessment - 01/26/15 0001    Assessment   Medical Diagnosis chronic neck pain   Onset Date 05/09/14   ROM / Strength   AROM / PROM / Strength AROM   AROM   AROM Assessment Site Cervical   Cervical - Right Side Bend 32   Cervical - Left Side Bend 25   Cervical - Right Rotation 60   Cervical - Left Rotation 50                   [facility] Adult PT Treatment/Exercise - 01/26/15 0001    Exercises   Exercises Neck;Shoulder   Neck Exercises: Machines for Strengthening   UBE (Upper Arm Bike) level 3, 2 min each direction   Neck Exercises:  Seated   Cervical Isometrics Right rotation;Left rotation;3 secs;10 reps   Other Seated Exercise lateral cerv flexion with towel stretch 30 sec x 2 rep each direction.    Neck Exercises: Supine   Cervical Isometrics --  head; shoulder pressess x 10 -3sec hold   Upper Extremity D2 15 reps;Theraband  each side (diagonal pulls)   Theraband Level (UE D2) Level 3 (Green)   Other Supine Exercise shoulder presses x 10 with 3 sec hold, thoracic lifts x 10    Shoulder Exercises: Standing   Row 15 reps;Both;Strengthening;Theraband   Theraband Level (Shoulder Row) Level 3 (Green)   Other Standing Exercises shoulder shrugs with green band x 10, pt reported increased pain in Lt trap after exercise   Manual Therapy   Manual Therapy Myofascial release   Myofascial Release to R/L scalenes and upper trap.  R tighter than Lt.  Lt first ribs very point tender. Suboccipital release x 3.  TRP to R/T rhomboid.                 PT  Education - 01/26/15 1749    Education provided Yes   Education Details HEP- added rowing, horiz ABD, and D2 Flex with green band    Person(s) Educated Patient   Methods Handout;Explanation;Demonstration   Comprehension Returned demonstration;Verbalized understanding             PT Long Term Goals - 01/11/15 1623    PT LONG TERM GOAL #1   Title I with advanced HEP   Time 6   Period Weeks   Status New   PT LONG TERM GOAL #2   Title demonstrate neck rotation Lt =/> 60 degrees   Time 6   Period Weeks   Status New   PT LONG TERM GOAL #3   Title report pain decrease =/> 75% during the day   Time 6   Period Weeks   Status New   PT LONG TERM GOAL #4   Title improve FOTO =/< 32%    Time 6   Period Weeks   Status New               Plan - 01/26/15 1634    Clinical Impression Statement Pt had decrease in cervical ROM from last week, however pt reported he had performed a lot of strenuous work at home over week.  Pt tolerated all new exercises well  without increase in symptoms (except resisted shoulder shrugs).  Pt reported decreased pain in neck after ther ex and manual.     Rehab Potential Good   PT Frequency 1x / week   PT Duration 6 weeks   PT Treatment/Interventions Moist Heat;Patient/family education;Passive range of motion;Therapeutic exercise;Ultrasound;Manual techniques;Cryotherapy;Electrical Stimulation   PT Next Visit Plan Continue manual, scapular and cervical strengthening, cervical ROM.    Consulted and Agree with Plan of Care Patient        Problem List Patient Active Problem List   Diagnosis Date Noted  . Hepatitis C   . Chest pain, atypical 07/31/2012  . COPD exacerbation 07/31/2012  . Tobacco abuse 07/31/2012  . Meniere's disease   . Back pain    Mayer CamelJennifer Carlson-Long, PTA 01/26/2015 5:49 PM  [facility] [place] [address] [place] [address] [place], [place], [address] Phone: [phone]   Fax:  [phone]

## 2015-02-02 ENCOUNTER — Ambulatory Visit (INDEPENDENT_AMBULATORY_CARE_PROVIDER_SITE_OTHER): Payer: BC Managed Care – PPO | Admitting: Physical Therapy

## 2015-02-02 DIAGNOSIS — M436 Torticollis: Secondary | ICD-10-CM

## 2015-02-02 DIAGNOSIS — M542 Cervicalgia: Secondary | ICD-10-CM

## 2015-02-02 NOTE — Therapy (Signed)
[facility] [place] [address] [place] [address] [place], [place], [address] Phone: [phone]   Fax:  [phone]  Physical Therapy Treatment  Patient Details  Name: Andrew Campos MRN: [id number] Date of Birth: May 28, 1961 Referring Provider:  Donita Brooks, MD  Encounter Date: 02/02/2015      PT End of Session - 02/02/15 1749    Visit Number 4   Number of Visits 6   Date for PT Re-Evaluation 02/22/15   PT Start Time 1703   PT Stop Time 1745   PT Time Calculation (min) 42 min   Activity Tolerance Patient tolerated treatment well      Past Medical History  Diagnosis Date  . Meniere's disease   . Back pain   . Hepatitis C     Hep C 1a genotype    Past Surgical History  Procedure Laterality Date  . Hernia repair    . Eye surgery      There were no vitals filed for this visit.  Visit Diagnosis:  Stiffness of neck  Pain, neck      Subjective Assessment - 02/02/15 1706    Symptoms Pt reports soreness from correcting posture.  Neck took 2-3 days after last session to calm down. Only feels pain if has heavy project or turns head to Lt.    Pain Score 1    Pain Location Neck   Pain Orientation Left   Pain Descriptors / Indicators Dull;Burning   Aggravating Factors  overhead work, lifting heavy objects    Pain Relieving Factors rest, heat.             [facility] PT Assessment - 02/02/15 0001    Assessment   Medical Diagnosis chronic neck pain   Onset Date 05/09/14   AROM   AROM Assessment Site Cervical   Cervical - Right Side Bend 45   Cervical - Left Side Bend 46   Cervical - Right Rotation 62   Cervical - Left Rotation 55                   [facility] Adult PT Treatment/Exercise - 02/02/15 0001    Exercises   Exercises Neck;Shoulder   Neck Exercises: Standing   Other Standing Exercises --   Neck Exercises: Seated   Other Seated Exercise lateral cerv flexion with towel stretch 30 sec x 2 rep each  direction.    Other Seated Exercise elbows on knees; cervical flex to neutral x 8 reps; cervical diagonals x 5 each.    Neck Exercises: Supine   Other Supine Exercise on 1/2 foam roll ; snow angels x 10    Neck Exercises: Prone   W Back 10 reps  tactile VC for form    Shoulder Extension 10 reps   Other Prone Exercise childs pose x 30 sec    Shoulder Exercises: Supine   Horizontal ABduction 20 reps   Theraband Level (Shoulder Horizontal ABduction) Level 3 (Green)   Other Supine Exercises D2 R/L with green band in hooklying x 15 each side.    Shoulder Exercises: Standing   Other Standing Exercises Wall angels x 10 - difficult   Shoulder Exercises: ROM/Strengthening   UBE (Upper Arm Bike) Standing, level 4 _- 2 min each direction.    Plank 3 reps  15 sec holds.                 PT Education - 02/02/15 1750    Education provided Yes   Education  Details HEP- added scapula strengthening prone shoulder ext and goal posts x 10 each, planks every other day, childs pose.    Person(s) Educated Patient   Methods Explanation;Demonstration;Handout   Comprehension Verbalized understanding             PT Long Term Goals - 01/11/15 1623    PT LONG TERM GOAL #1   Title I with advanced HEP   Time 6   Period Weeks   Status New   PT LONG TERM GOAL #2   Title demonstrate neck rotation Lt =/> 60 degrees   Time 6   Period Weeks   Status New   PT LONG TERM GOAL #3   Title report pain decrease =/> 75% during the day   Time 6   Period Weeks   Status New   PT LONG TERM GOAL #4   Title improve FOTO =/< 32%    Time 6   Period Weeks   Status New               Plan - 02/02/15 1751    Clinical Impression Statement Pt demo improved cervical ROM this date. Pt tolerated all new exercises with minimal increase in symptoms; relieved with shoulder/neck stretches. Making good progress towards goals.    Pt will benefit from skilled therapeutic intervention in order to improve on  the following deficits Decreased strength;Decreased range of motion;Postural dysfunction;Increased muscle spasms;Pain   Rehab Potential Good   PT Frequency 1x / week   PT Duration 6 weeks   PT Treatment/Interventions Moist Heat;Patient/family education;Passive range of motion;Therapeutic exercise;Ultrasound;Manual techniques;Cryotherapy;Electrical Stimulation   PT Next Visit Plan Continue scapular and cervical strengthening, cervical ROM and manual PRN.    Consulted and Agree with Plan of Care Patient        Problem List Patient Active Problem List   Diagnosis Date Noted  . Hepatitis C   . Chest pain, atypical 07/31/2012  . COPD exacerbation 07/31/2012  . Tobacco abuse 07/31/2012  . Meniere's disease   . Back pain    Mayer CamelJennifer Carlson-Long, PTA 02/02/2015 5:59 PM   [facility] [address] [place] [address] [place], [place], [address] Phone: [phone]   Fax:  [phone]

## 2015-02-02 NOTE — Patient Instructions (Signed)
Child Pose   Sitting on knees, fold body over legs and relax head and arms on floor. Hold for __3__ breaths.  Copyright  VHI. All rights reserved.  Plank   Support body on hands and feet. Keep hips in line with torso and arms straight under chest. Avoid locking elbows. Hold for __15-30__ seconds. Repeat with other leg. ADVANCED: Extend one leg up.  Copyright  VHI. All rights reserved.

## 2015-02-09 ENCOUNTER — Encounter: Payer: Self-pay | Admitting: Physical Therapy

## 2015-02-09 ENCOUNTER — Ambulatory Visit (INDEPENDENT_AMBULATORY_CARE_PROVIDER_SITE_OTHER): Payer: BC Managed Care – PPO | Admitting: Physical Therapy

## 2015-02-09 DIAGNOSIS — M542 Cervicalgia: Secondary | ICD-10-CM

## 2015-02-09 DIAGNOSIS — M436 Torticollis: Secondary | ICD-10-CM | POA: Diagnosis not present

## 2015-02-09 NOTE — Therapy (Signed)
[place], [place], [address] Phone: [phone]   Fax:  [phone]  Physical Therapy Treatment  Patient Details  Name: Andrew Campos MRN: [id number] Date of Birth: 03-27-1961 Referring Provider:  Susy Frizzle, MD  Encounter Date: 02/09/2015      PT End of Session - 02/09/15 1707    Visit Number 5   Number of Visits 6   Date for PT Re-Evaluation 02/22/15   PT Start Time 1707   PT Stop Time 1737   PT Time Calculation (min) 30 min   Activity Tolerance Treatment limited secondary to medical complications (Comment)  vertigo limited treatment      Past Medical History  Diagnosis Date  . Meniere's disease   . Back pain   . Hepatitis C     Hep C 1a genotype    Past Surgical History  Procedure Laterality Date  . Hernia repair    . Eye surgery      There were no vitals filed for this visit.  Visit Diagnosis:  Stiffness of neck  Pain, neck      Subjective Assessment - 02/09/15 1709    Symptoms vertigo is really bad and he can't lay down, very stressfull time over the week. Feels like the exercise from last wk flared up his low back   Currently in Pain? Yes   Pain Score 2    Pain Location Neck   Pain Orientation Left   Pain Descriptors / Indicators Dull;Sore   Pain Type Chronic pain   Pain Frequency Constant   Aggravating Factors  overhead work lifting   Pain Relieving Factors rest, heat   Multiple Pain Sites Yes   Pain Score 1   Pain Location Back   Pain Orientation Lower   Pain Descriptors / Indicators Aching   Pain Type Chronic pain            [facility] PT Assessment - 02/09/15 0001    AROM   AROM Assessment Site Cervical  not assessed due to vertigo today                   [facility] Adult PT Treatment/Exercise - 02/09/15 0001    Neck Exercises: Machines for Strengthening   UBE (Upper Arm Bike) level 3, 2 min each direction   Neck Exercises: Standing    Neck Retraction 15 reps  head presses into the wall   Other Standing Exercises shoulder pressess into the wall x 15   Other Standing Exercises shoulder shrugs   Neck Exercises: Prone   Axial Exentsion 20 reps  POE   Other Prone Exercise POE neck diagonals to both sides.    Shoulder Exercises: Standing   External Rotation Strengthening;20 reps;Theraband   Theraband Level (Shoulder External Rotation) Level 4 (Blue)                     PT Long Term Goals - 02/09/15 1727    PT LONG TERM GOAL #1   Title I with advanced HEP   Status On-going   PT LONG TERM GOAL #2   Title demonstrate neck rotation Lt =/> 60 degrees   Status On-going   PT LONG TERM GOAL #3   Title report pain decrease =/> 75% during the day  pt reports 20% improvement in pain   Status On-going   PT LONG TERM GOAL #4   Title improve FOTO =/< 32%    Status On-going  Plan - 02/09/15 1731    Clinical Impression Statement Pt with flare up of vertigo that is limiting his ability to perform ther ex. He also has a lot of stress in his personal life that has caused an increase in symptoms. No new goals met.    Pt will benefit from skilled therapeutic intervention in order to improve on the following deficits Decreased strength;Decreased range of motion;Postural dysfunction;Increased muscle spasms;Pain   Rehab Potential Good   PT Frequency 1x / week   PT Duration 6 weeks   PT Treatment/Interventions Moist Heat;Patient/family education;Passive range of motion;Therapeutic exercise;Ultrasound;Manual techniques;Cryotherapy;Electrical Stimulation   PT Next Visit Plan FOTO, assess ROM/strength   Consulted and Agree with Plan of Care Patient        Problem List Patient Active Problem List   Diagnosis Date Noted  . Hepatitis C   . Chest pain, atypical 07/31/2012  . COPD exacerbation 07/31/2012  . Tobacco abuse 07/31/2012  . Meniere's disease   . Back pain     Jeral Pinch,  PT 02/09/2015, 5:42 PM  [facility] [place], [place], [address] Phone: [phone]   Fax:  [phone]

## 2015-02-16 ENCOUNTER — Other Ambulatory Visit (HOSPITAL_COMMUNITY): Payer: Self-pay | Admitting: Nurse Practitioner

## 2015-02-16 ENCOUNTER — Ambulatory Visit (INDEPENDENT_AMBULATORY_CARE_PROVIDER_SITE_OTHER): Payer: BC Managed Care – PPO | Admitting: Physical Therapy

## 2015-02-16 DIAGNOSIS — M436 Torticollis: Secondary | ICD-10-CM | POA: Diagnosis not present

## 2015-02-16 DIAGNOSIS — B182 Chronic viral hepatitis C: Secondary | ICD-10-CM

## 2015-02-16 NOTE — Therapy (Signed)
[place], [place], [address] Phone: [phone]   Fax:  [phone]  Physical Therapy Treatment  Patient Details  Name: Andrew Campos MRN: [id number] Date of Birth: [birth year] Referring Provider:  Susy Frizzle, MD  Encounter Date: 02/16/2015      PT End of Session - 02/16/15 1714    Visit Number 6   Number of Visits 6   Date for PT Re-Evaluation 02/22/15   PT Start Time 0301   PT Stop Time 3143   PT Time Calculation (min) 38 min   Activity Tolerance No increased pain;Patient tolerated treatment well      Past Medical History  Diagnosis Date  . Meniere's disease   . Back pain   . Hepatitis C     Hep C 1a genotype    Past Surgical History  Procedure Laterality Date  . Hernia repair    . Eye surgery      There were no vitals filed for this visit.  Visit Diagnosis:  Stiffness of neck      Subjective Assessment - 02/16/15 1715    Subjective Pt reported he feels like he has improved since eval. Neck has felt good; but also hasn't performed any strenuous or overhead work.    Currently in Pain? No/denies   Pain Score 0-No pain            [facility] PT Assessment - 02/16/15 0001    Assessment   Medical Diagnosis chronic neck pain   Onset Date 05/09/14   AROM   AROM Assessment Site Cervical   Cervical - Right Side Bend 45   Cervical - Left Side Bend 30   Cervical - Right Rotation 67   Cervical - Left Rotation 62                   [facility] Adult PT Treatment/Exercise - 02/16/15 0001    Neck Exercises: Prone   Plank 2 reps x 15 sec.    Shoulder Exercises: Supine   Flexion Both;10 reps;Strengthening;Theraband  with hor abd, narrow grip   Theraband Level (Shoulder Flexion) Level 3 (Green)   Shoulder Exercises: Standing   External Rotation 10 reps;Both;Theraband   Theraband Level (Shoulder External Rotation) Level 3 (Green)   Other Standing Exercises Horizontal  ABD with blue band x 10 (too difficult), with green band x 10; D2 with green band x 10 each direction.    Shoulder Exercises: ROM/Strengthening   UBE (Upper Arm Bike) Level 4 - standing; 2 min each way.    Shoulder Exercises: Stretch   Corner Stretch 2 reps;30 seconds   Cross Chest Stretch 2 reps;20 seconds   Other Shoulder Stretches shoulder ext stretch x 30 sec x 2                      PT Long Term Goals - 02/16/15 1749    PT LONG TERM GOAL #1   Title I with advanced HEP   Time 6   Period Weeks   Status Achieved   PT LONG TERM GOAL #2   Title demonstrate neck rotation Lt =/> 60 degrees   Time 6   Period Weeks   Status Achieved   PT LONG TERM GOAL #3   Title report pain decrease =/> 75% during the day   Time 6   Period Weeks   Status Achieved   PT LONG TERM GOAL #4   Title improve FOTO =/<  32%    Time 6   Period Weeks   Status Not Met  scored 43% limited.                Plan - 02/16/15 1748    Clinical Impression Statement Pt tolerated all new exercises without increased pain. Pt has partially met his goals.    Pt will benefit from skilled therapeutic intervention in order to improve on the following deficits Decreased strength;Decreased range of motion;Postural dysfunction;Increased muscle spasms;Pain   Rehab Potential Good   PT Next Visit Plan Pt satisfied with current level of function and interested in D/C.  Spoke to supervising PT regarding pt's requests.    Consulted and Agree with Plan of Care Patient        Problem List Patient Active Problem List   Diagnosis Date Noted  . Hepatitis C   . Chest pain, atypical 07/31/2012  . COPD exacerbation 07/31/2012  . Tobacco abuse 07/31/2012  . Meniere's disease   . Back pain    Kerin Perna, PTA 02/16/2015 5:54 PM   [facility] [place], [place], [address] Phone: [phone]   Fax:  [phone]

## 2015-02-16 NOTE — Therapy (Signed)
[facility] [address] , [place], [address] Phone: [phone]   Fax:  [phone]  Physical Therapy Treatment  Patient Details  Name: Andrew Campos MRN: [id number] Date of Birth: 11/21/1960 Referring Provider:  Pickard, Warren T, MD  Encounter Date: 02/16/2015      PT End of Session - 02/16/15 1714    Visit Number 6   Number of Visits 6   Date for PT Re-Evaluation 02/22/15   PT Start Time 1707   PT Stop Time 1745   PT Time Calculation (min) 38 min   Activity Tolerance No increased pain;Patient tolerated treatment well      Past Medical History  Diagnosis Date  . Meniere's disease   . Back pain   . Hepatitis C     Hep C 1a genotype    Past Surgical History  Procedure Laterality Date  . Hernia repair    . Eye surgery      There were no vitals filed for this visit.  Visit Diagnosis:  Stiffness of neck      Subjective Assessment - 02/16/15 1715    Subjective Pt reported he feels like he has improved since eval. Neck has felt good; but also hasn't performed any strenuous or overhead work.    Currently in Pain? No/denies   Pain Score 0-No pain            [facility] PT Assessment - 02/16/15 0001    Assessment   Medical Diagnosis chronic neck pain   Onset Date 05/09/14   AROM   AROM Assessment Site Cervical   Cervical - Right Side Bend 45   Cervical - Left Side Bend 30   Cervical - Right Rotation 67   Cervical - Left Rotation 62                   [facility] Adult PT Treatment/Exercise - 02/16/15 0001    Neck Exercises: Prone   Plank 2 reps x 15 sec.    Shoulder Exercises: Supine   Flexion Both;10 reps;Strengthening;Theraband  with hor abd, narrow grip   Theraband Level (Shoulder Flexion) Level 3 (Green)   Shoulder Exercises: Standing   External Rotation 10 reps;Both;Theraband   Theraband Level (Shoulder External Rotation) Level 3 (Green)   Other Standing Exercises Horizontal  ABD with blue band x 10 (too difficult), with green band x 10; D2 with green band x 10 each direction.    Shoulder Exercises: ROM/Strengthening   UBE (Upper Arm Bike) Level 4 - standing; 2 min each way.    Shoulder Exercises: Stretch   Corner Stretch 2 reps;30 seconds   Cross Chest Stretch 2 reps;20 seconds   Other Shoulder Stretches shoulder ext stretch x 30 sec x 2                      PT Long Term Goals - 02/16/15 1749    PT LONG TERM GOAL #1   Title I with advanced HEP   Time 6   Period Weeks   Status Achieved   PT LONG TERM GOAL #2   Title demonstrate neck rotation Lt =/> 60 degrees   Time 6   Period Weeks   Status Achieved   PT LONG TERM GOAL #3   Title report pain decrease =/> 75% during the day   Time 6   Period Weeks   Status Achieved   PT LONG TERM GOAL #4   Title improve FOTO =/<   32%    Time 6   Period Weeks   Status Not Met  scored 43% limited.                Plan - 02/16/15 1748    Clinical Impression Statement Pt tolerated all new exercises without increased pain. Pt has partially met his goals.    Pt will benefit from skilled therapeutic intervention in order to improve on the following deficits Decreased strength;Decreased range of motion;Postural dysfunction;Increased muscle spasms;Pain   Rehab Potential Good   PT Next Visit Plan Pt satisfied with current level of function and interested in D/C.  Spoke to supervising PT regarding pt's requests.    Consulted and Agree with Plan of Care Patient        Problem List Patient Active Problem List   Diagnosis Date Noted  . Hepatitis C   . Chest pain, atypical 07/31/2012  . COPD exacerbation 07/31/2012  . Tobacco abuse 07/31/2012  . Meniere's disease   . Back pain     SHAVER,SUE 02/16/2015, 6:00 PM  [facility] [place], [place], [address] Phone: [phone]   Fax:  [phone]   PHYSICAL THERAPY DISCHARGE  SUMMARY  Visits from Start of Care: 6  Current functional level related to goals / functional outcomes: As above   Remaining deficits: As above  Education / Equipment: HEP Plan: Patient agrees to discharge.  Patient goals were partially met. Patient is being discharged due to being pleased with the current functional level.  ?????     Jeral Pinch, PT

## 2015-03-14 ENCOUNTER — Ambulatory Visit (HOSPITAL_COMMUNITY): Payer: BC Managed Care – PPO

## 2015-03-29 ENCOUNTER — Telehealth (HOSPITAL_COMMUNITY): Payer: Self-pay

## 2015-03-29 NOTE — Telephone Encounter (Signed)
Called to remind pt of 8am appt on 03/30/15.. Pt agrees to not have anything to eat or drink 6hrs prior to exam. AW

## 2015-03-30 ENCOUNTER — Ambulatory Visit (HOSPITAL_COMMUNITY)
Admission: RE | Admit: 2015-03-30 | Discharge: 2015-03-30 | Disposition: A | Discharge status: Home / Self Care | Payer: BC Managed Care – PPO | Source: Ambulatory Visit | Attending: Nurse Practitioner | Admitting: Nurse Practitioner

## 2015-03-30 DIAGNOSIS — B182 Chronic viral hepatitis C: Secondary | ICD-10-CM | POA: Diagnosis present

## 2015-09-02 IMAGING — US US ABDOMEN COMPLETE W/ ELASTOGRAPHY
1 series · 13 of 14 positions shown · non-contrast
Comparison: None.

ADDENDUM:
Dictation error in the original elastography report. The median
velocity is 1.44 m/sec. This corresponds to a Metavir fibrosis score
of F2/F3, not F0/F1 as originally reported. As such, the risk of
fibrosis is considered moderate, and additional testing is
appropriate.

ADDENDED IMPRESSION:
Unremarkable abdominal ultrasound.
Median hepatic shear wave velocity is calculated at 1.44 m/sec.
Corresponding Metavir fibrosis score is F2/F3.
Risk of fibrosis is moderate.
Follow-up: Additional testing appropriate.
CLINICAL DATA: Chronic hepatitis C
EXAM:
ULTRASOUND ABDOMEN COMPLETE
ULTRASOUND HEPATIC ELASTOGRAPHY
TECHNIQUE: Sonography of the upper abdomen was performed. In addition,
ultrasound elastography evaluation of the liver was performed. A
region of interest was placed within the right lobe of the liver.
Following application of a compressive sonographic pulse, shear
waves were detected in the adjacent hepatic tissue and the shear
wave velocity was calculated. Multiple assessments were performed at
the selected site. Median shear wave velocity is correlated to a
Metavir fibrosis score.

[Series 1: us abdomen complete w/ elastography · 0.12mm/px · 13 of 14 slices shown]
[im 1/14]
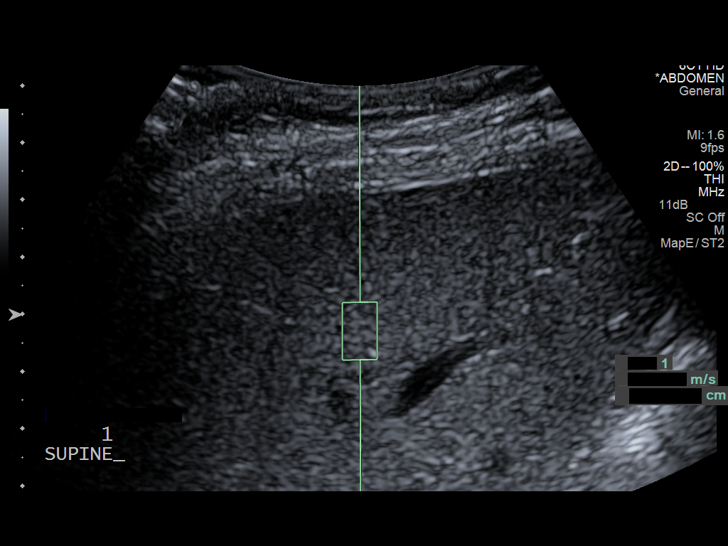
[im 2/14]
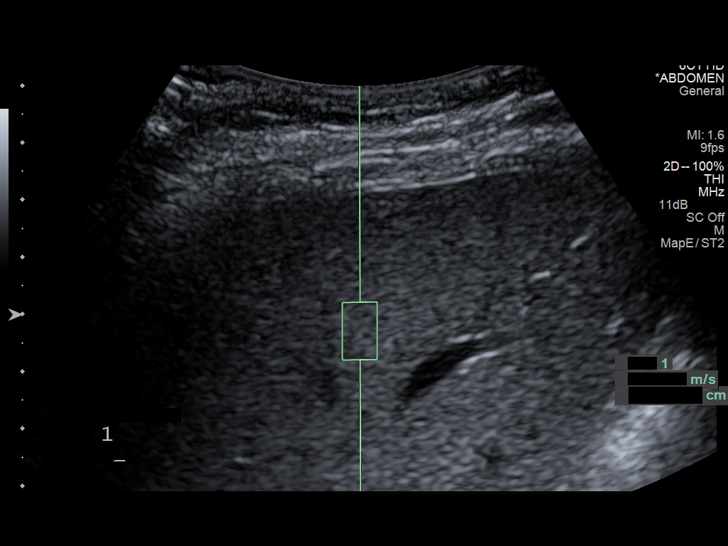
[im 3/14]
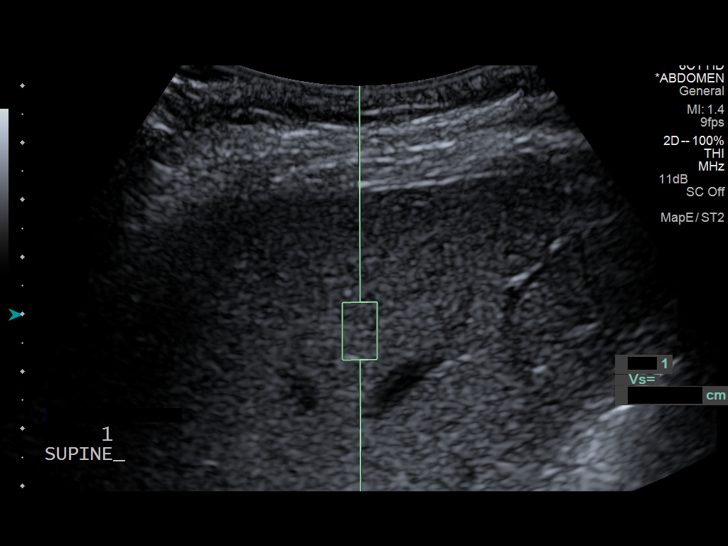
[im 4/14]
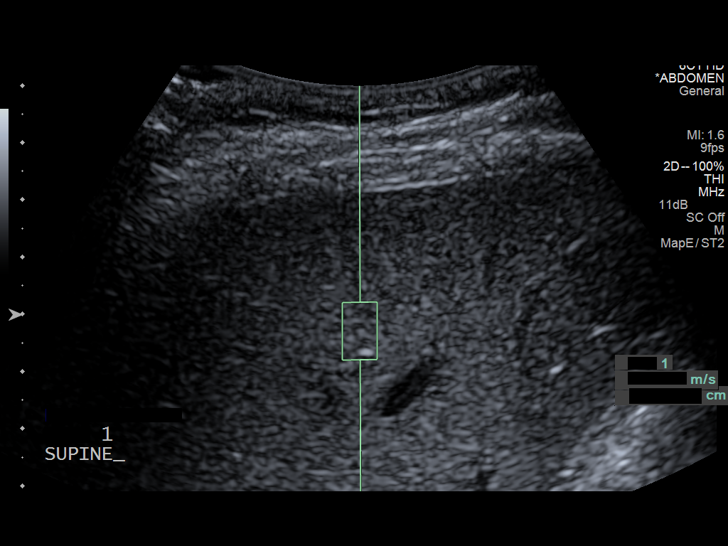
[im 5/14]
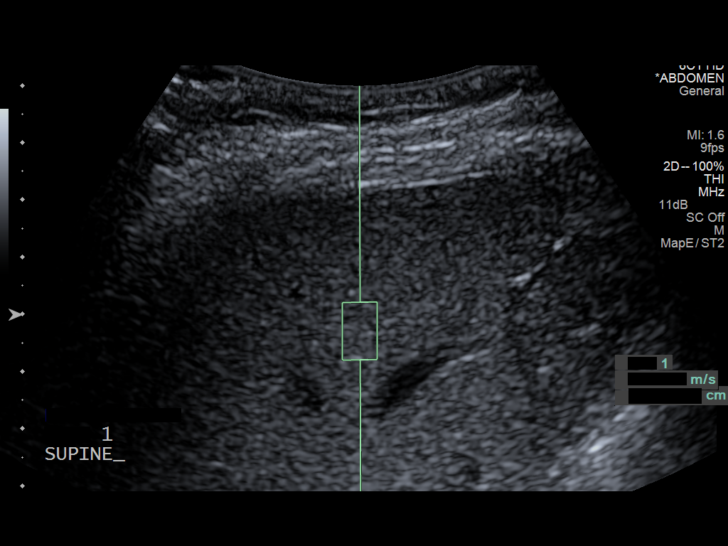
[im 6/14]
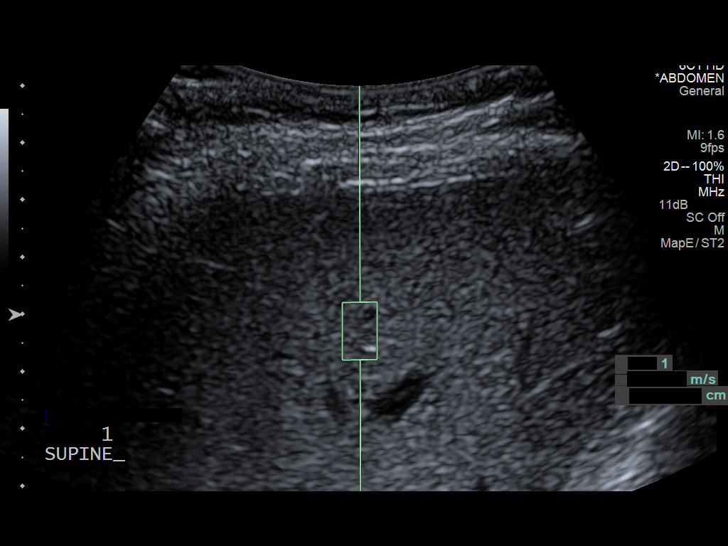
[im 8/14]
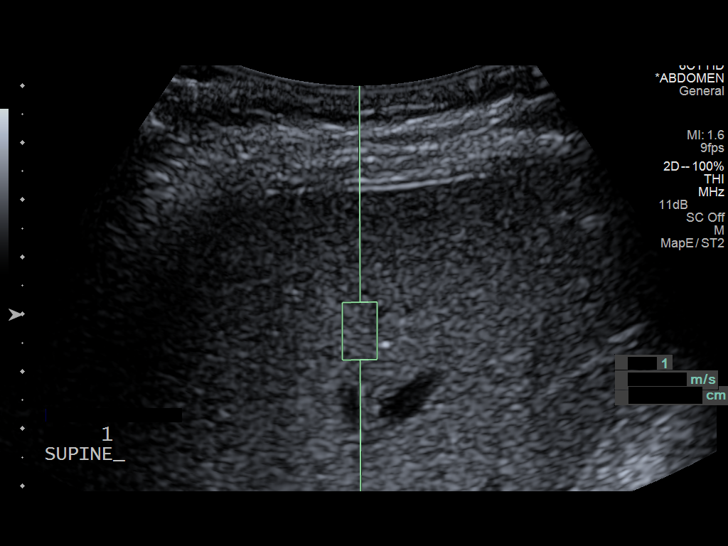
[im 9/14]
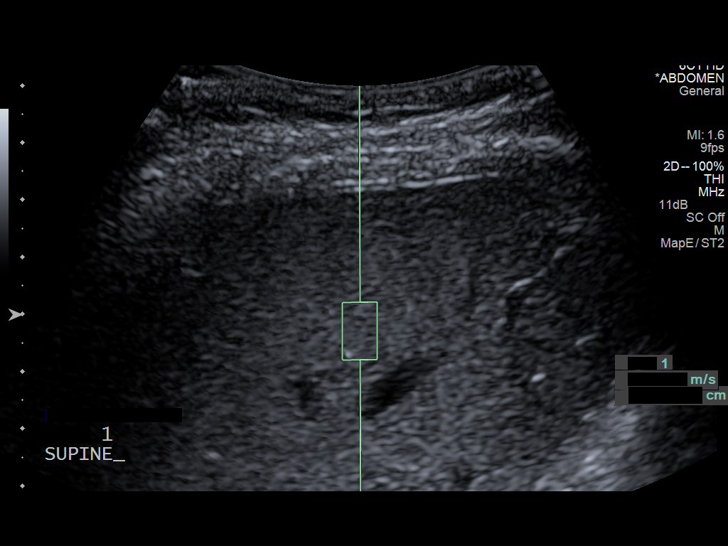
[im 10/14]
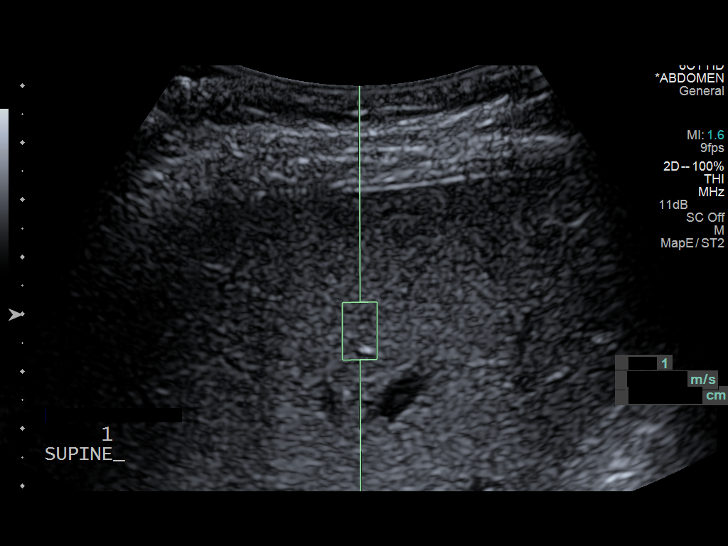
[im 11/14]
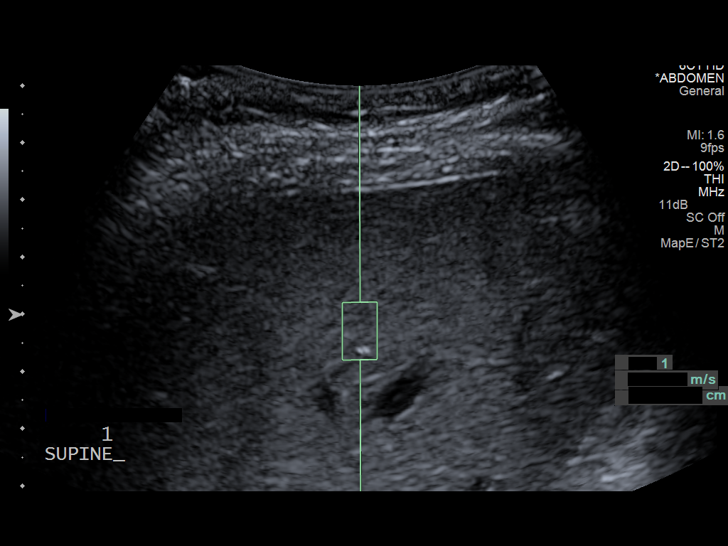
[im 12/14]
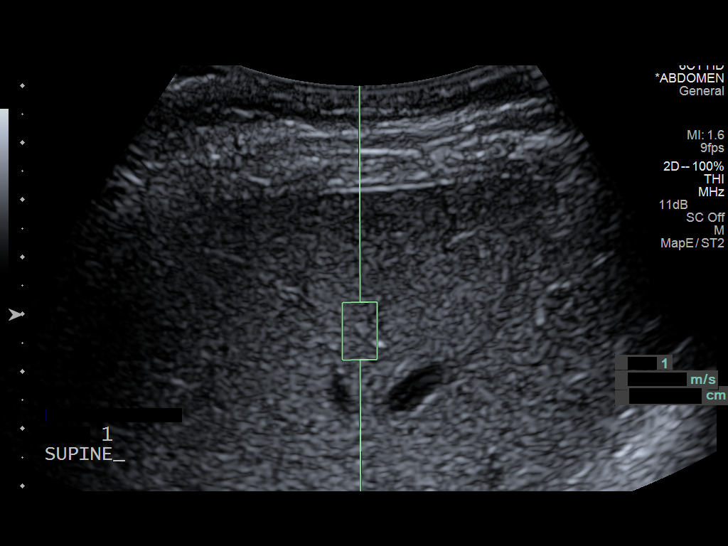
[im 13/14]
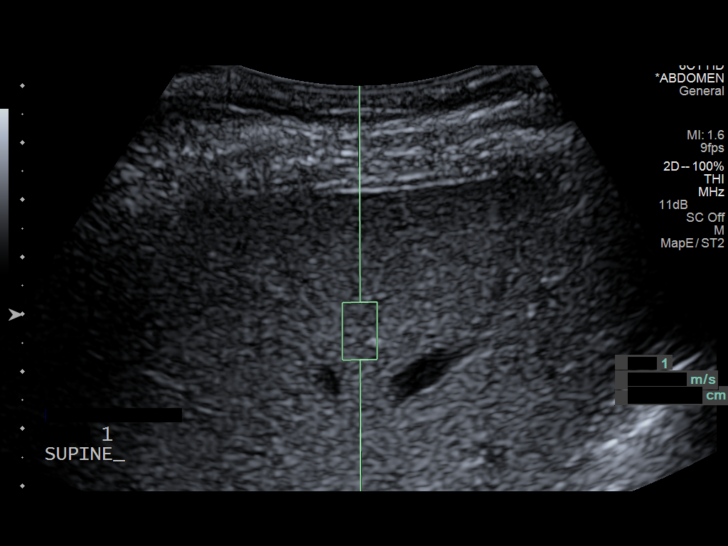
[im 14/14]
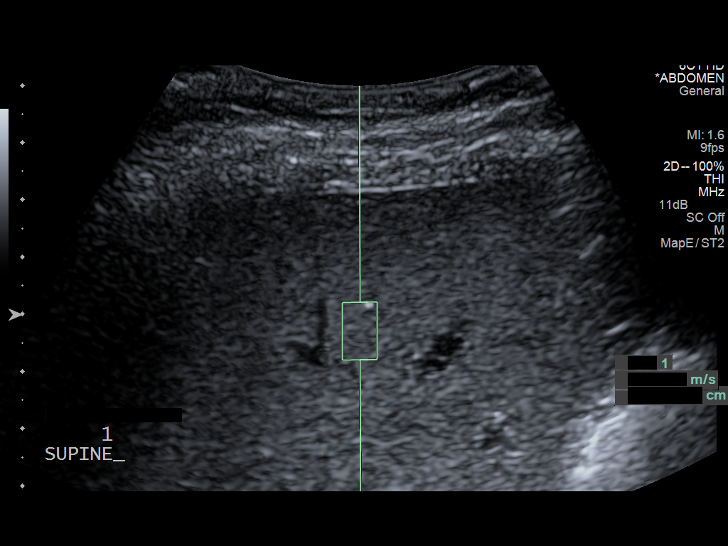

[13 of 14 positions shown; findings below may reference images not displayed]

FINDINGS: ULTRASOUND ABDOMEN

Gallbladder: No gallstones, gallbladder wall thickening, or
pericholecystic fluid. Negative sonographic Murphy's sign.

Common bile duct: Diameter: 3 mm

Liver: Coarse hepatic echotexture.  No focal hepatic lesion is seen.

IVC: No abnormality visualized.

Pancreas: Visualized portion unremarkable.

Spleen: Size and appearance within normal limits.

Right Kidney: Length: 10.4 cm. No mass or hydronephrosis.

Left Kidney: Length: 11.4 cm. No mass or hydronephrosis.

Abdominal aorta: No aneurysm visualized.

Other findings: None.

ULTRASOUND HEPATIC ELASTOGRAPHY

Device: Siemens Helix VTQ

Transducer 6C1

Patient position: Supine

Number of measurements:  10

Hepatic Segment:  8

Median velocity:   1.44  m/sec

IQR:

IQR/Median velocity ratio

Corresponding Metavir fibrosis score:  F0/F1

Risk of fibrosis: Minimal

Limitations of exam: None

Pertinent findings noted on other imaging exams:  None

Please note that abnormal shear wave velocities may also be
identified in clinical settings other than with hepatic fibrosis,
such as: acute hepatitis, elevated right heart and central venous
pressures including use of beta blockers, Roner disease
([place]), infiltrative processes such as
mastocytosis/amyloidosis/infiltrative tumor, extrahepatic
cholestasis, in the post-prandial state, and liver transplantation.
Correlation with patient history, laboratory data, and clinical
condition recommended.
IMPRESSION: Unremarkable abdominal ultrasound.

Median hepatic shear wave velocity is calculated at 1.44 m/sec.

Corresponding Metavir fibrosis score is F0/F1.

Risk of fibrosis is minimal.

Follow-up:  None required.

## 2016-03-27 ENCOUNTER — Other Ambulatory Visit: Payer: Self-pay | Admitting: Nurse Practitioner

## 2016-03-27 DIAGNOSIS — K7469 Other cirrhosis of liver: Secondary | ICD-10-CM

## 2016-04-23 ENCOUNTER — Encounter: Payer: Self-pay | Admitting: Family Medicine

## 2017-01-29 ENCOUNTER — Ambulatory Visit (INDEPENDENT_AMBULATORY_CARE_PROVIDER_SITE_OTHER): Payer: BC Managed Care – PPO | Admitting: Physician Assistant

## 2017-01-29 ENCOUNTER — Encounter: Payer: Self-pay | Admitting: Physician Assistant

## 2017-01-29 VITALS — BP 128/82 | HR 74 | Temp 98.0°F | Resp 14 | Wt 141.8 lb

## 2017-01-29 DIAGNOSIS — J988 Other specified respiratory disorders: Secondary | ICD-10-CM

## 2017-01-29 DIAGNOSIS — Z716 Tobacco abuse counseling: Secondary | ICD-10-CM

## 2017-01-29 DIAGNOSIS — B9689 Other specified bacterial agents as the cause of diseases classified elsewhere: Principal | ICD-10-CM

## 2017-01-29 DIAGNOSIS — Z720 Tobacco use: Secondary | ICD-10-CM

## 2017-01-29 MED ORDER — PREDNISONE 20 MG PO TABS: 20.0000 mg | ORAL_TABLET | Freq: Every day | ORAL | 0 refills | Status: AC

## 2017-01-29 MED ORDER — AZITHROMYCIN 250 MG PO TABS: ORAL_TABLET | ORAL | 0 refills | Status: DC

## 2017-01-29 MED ORDER — ALBUTEROL SULFATE HFA 108 (90 BASE) MCG/ACT IN AERS: 2.0000 | INHALATION_SPRAY | Freq: Four times a day (QID) | RESPIRATORY_TRACT | 2 refills | Status: AC | PRN

## 2017-01-29 MED ORDER — VARENICLINE TARTRATE 0.5 MG X 11 & 1 MG X 42 PO MISC: ORAL | 0 refills | Status: AC

## 2017-01-29 NOTE — Progress Notes (Signed)
Patient ID: Andrew Campos MRN: [id number], DOB: 12/21/1960, 56 y.o. Date of Encounter: 01/29/2017, 2:33 PM    Chief Complaint:  Chief Complaint  Patient presents with  . Wheezing    x5days  . Headache  . bodyaches     HPI: 56 y.o. year old male presetns with above.   He says symptoms started 7 days ago. Says that Friday at 3 AM he developed chills and body aches and felt congestion in his sinuses and then developed cough. Felt very sick and stayed in the bed a lot the first 2 days. At this point he is mostly having a lot of cough and chest congestion. Was getting some mucus out of his nose but now is getting very little out of his nose. For the last couple of days he has heard some wheezing at night. He is a smoker but he is trying to quit. Notes that he currently takes no medications.     Home Meds:   Outpatient Medications Prior to Visit  Medication Sig Dispense Refill  . meloxicam (MOBIC) 15 MG tablet Take 1 tablet (15 mg total) by mouth daily. 30 tablet 0   No facility-administered medications prior to visit.     Allergies:  Allergies  Allergen Reactions  . Penicillins Hives      Review of Systems: See HPI for pertinent ROS. All other ROS negative.    Physical Exam: Blood pressure 128/82, pulse 74, temperature 98 F (36.7 C), temperature source Oral, resp. rate 14, weight 141 lb 12.8 oz (64.3 kg), SpO2 98 %., Body mass index is 21.56 kg/m. General: WNWD WM.  Appears in no acute distress. HEENT: Normocephalic, atraumatic, eyes without discharge, sclera non-icteric, nares are without discharge. Bilateral auditory canals clear, TM's are without perforation, pearly grey and translucent with reflective cone of light bilaterally. Oral cavity moist, posterior pharynx without exudate, erythema, peritonsillar abscess. No tenderness with percussion to frontal or maxillary sinuses bilaterally.  Neck: Supple. No thyromegaly. No lymphadenopathy. Lungs: Clear bilaterally  to auscultation without wheezes, rales, or rhonchi. Breathing is unlabored. Heart: Regular rhythm. No murmurs, rubs, or gallops. Msk:  Strength and tone normal for age. Extremities/Skin: Warm and dry.  Neuro: Alert and oriented X 3. Moves all extremities spontaneously. Gait is normal. CNII-XII grossly in tact. Psych:  Responds to questions appropriately with a normal affect.     ASSESSMENT AND PLAN:  56 y.o. year old male with  1. Bacterial respiratory infection He is to take the following medicines as directed. Follow-up if symptoms do not resolve within 1 week after completion of antibiotic and prednisone. - azithromycin (ZITHROMAX) 56 MG tablet; Day 1: Take 2 daily. Days 2 - 5: Take 1 daily.  Dispense: 6 tablet; Refill: 0 - albuterol (PROVENTIL HFA;VENTOLIN HFA) 108 (90 Base) MCG/ACT inhaler; Inhale 2 puffs into the lungs every 6 (six) hours as needed for wheezing or shortness of breath.  Dispense: 1 Inhaler; Refill: 2 - predniSONE (DELTASONE) 20 MG tablet; Take 1 tablet (20 mg total) by mouth daily with breakfast.  Dispense: 5 tablet; Refill: 0  2. Encounter for smoking cessation counseling Discussed possible adverse effects with Chantix. He is to take the Chantix as directed and call/follow-up immediately if develops adverse effects. Also gave and reviewed handout with tips for smoking cessation. - varenicline (CHANTIX STARTING MONTH PAK) 0.5 MG X 11 & 1 MG X 42 tablet; Take one 0.5 mg tablet by mouth once daily for 3 days, then increase to one 0.5  mg tablet twice daily for 4 days, then increase to one 1 mg tablet twice daily.  Dispense: 53 tablet; Refill: 0   Signed, [address]Crystallee Werden Beth North AdamsDixon, Georgia[place], [facility]lBSFM 01/29/2017 2:33 PM

## 2017-02-05 ENCOUNTER — Telehealth: Payer: Self-pay

## 2017-02-05 MED ORDER — LEVOFLOXACIN 750 MG PO TABS: 750.0000 mg | ORAL_TABLET | Freq: Every day | ORAL | 0 refills | Status: AC

## 2017-02-05 NOTE — Telephone Encounter (Signed)
Patient is aware that Rx has been called in and that if his symptoms do not resolve then he would need to come in for f/u visit.  Patient understands and is agreeable with this.

## 2017-02-05 NOTE — Telephone Encounter (Signed)
Patient was seen in the office on 3-21 and was diagnosed with bacterial infection,However patient called today and states he is not feeling any better. He is still congested and has Vertigo which is causing him to be out of work. When asked if there were any other symptoms he was experiencing patient stated just the congestion and vertigo at this time.  Patient is calling to see if another round of antibiotics would clear up the infection. Pls advise

## 2017-02-05 NOTE — Telephone Encounter (Signed)
Levaquin 750mg  1 po QD x 7 days  # 7 + 0 If symptoms do not resolve after this, come in for another OV.

## 2017-06-10 ENCOUNTER — Other Ambulatory Visit (HOSPITAL_COMMUNITY): Payer: Self-pay | Admitting: Nurse Practitioner

## 2017-06-10 DIAGNOSIS — B182 Chronic viral hepatitis C: Secondary | ICD-10-CM

## 2017-10-17 ENCOUNTER — Ambulatory Visit: Payer: BC Managed Care – PPO | Admitting: Family Medicine

## 2020-01-13 ENCOUNTER — Emergency Department (HOSPITAL_COMMUNITY)
Admission: EM | Admit: 2020-01-13 | Discharge: 2020-01-13 | Disposition: A | Discharge status: Home / Self Care | Payer: BC Managed Care – PPO | Attending: Emergency Medicine | Admitting: Emergency Medicine

## 2020-01-13 ENCOUNTER — Encounter (HOSPITAL_COMMUNITY): Payer: Self-pay | Admitting: Emergency Medicine

## 2020-01-13 ENCOUNTER — Emergency Department (HOSPITAL_BASED_OUTPATIENT_CLINIC_OR_DEPARTMENT_OTHER): Payer: BC Managed Care – PPO

## 2020-01-13 ENCOUNTER — Other Ambulatory Visit: Payer: Self-pay

## 2020-01-13 DIAGNOSIS — M79661 Pain in right lower leg: Secondary | ICD-10-CM | POA: Insufficient documentation

## 2020-01-13 DIAGNOSIS — F17200 Nicotine dependence, unspecified, uncomplicated: Secondary | ICD-10-CM | POA: Diagnosis not present

## 2020-01-13 DIAGNOSIS — M79609 Pain in unspecified limb: Secondary | ICD-10-CM | POA: Diagnosis not present

## 2020-01-13 LAB — BASIC METABOLIC PANEL
Anion gap: 7 (ref 5–15)
BUN: 16 mg/dL (ref 6–20)
CO2: 29 mmol/L (ref 22–32)
Calcium: 9.6 mg/dL (ref 8.9–10.3)
Chloride: 104 mmol/L (ref 98–111)
Creatinine, Ser: 1.01 mg/dL (ref 0.61–1.24)
GFR calc Af Amer: >60 mL/min (ref >60)
GFR calc non Af Amer: >60 mL/min (ref >60)
Glucose, Bld: 95 mg/dL (ref 70–99)
Potassium: 3.8 mmol/L (ref 3.5–5.1)
Sodium: 140 mmol/L (ref 135–145)

## 2020-01-13 LAB — CBC WITH DIFFERENTIAL/PLATELET
Abs Immature Granulocytes: 0.01 10*3/uL (ref 0.00–0.07)
Basophils Absolute: 0 10*3/uL (ref 0.0–0.1)
Basophils Relative: 1 %
Eosinophils Absolute: 0.3 10*3/uL (ref 0.0–0.5)
Eosinophils Relative: 5 %
HCT: 44.4 % (ref 39.0–52.0)
Hemoglobin: 14.1 g/dL (ref 13.0–17.0)
Immature Granulocytes: 0 %
Lymphocytes Relative: 41 %
Lymphs Abs: 3.1 10*3/uL (ref 0.7–4.0)
MCH: 25.9 pg — ABNORMAL LOW (ref 26.0–34.0)
MCHC: 31.8 g/dL (ref 30.0–36.0)
MCV: 81.5 fL (ref 80.0–100.0)
Monocytes Absolute: 0.6 10*3/uL (ref 0.1–1.0)
Monocytes Relative: 8 %
Neutro Abs: 3.5 10*3/uL (ref 1.7–7.7)
Neutrophils Relative %: 45 %
Platelets: 197 10*3/uL (ref 150–400)
RBC: 5.45 MIL/uL (ref 4.22–5.81)
RDW: 13.7 % (ref 11.5–15.5)
WBC: 7.6 10*3/uL (ref 4.0–10.5)
nRBC: 0 % (ref 0.0–0.2)

## 2020-01-13 MED ORDER — IBUPROFEN 800 MG PO TABS
800.0000 mg | ORAL_TABLET | Freq: Three times a day (TID) | ORAL | 0 refills | Status: AC | PRN
Start: 2020-01-13 — End: ?

## 2020-01-13 NOTE — Discharge Instructions (Addendum)
Follow up with your md if not improving. °

## 2020-01-13 NOTE — Progress Notes (Addendum)
Right lower extremity venous duplex completed. Refer to "CV Proc" under chart review to view preliminary results.  01/13/2020 4:37 PM Eula Fried., MHA, RVT, RDCS, RDMS

## 2020-01-13 NOTE — ED Notes (Signed)
Dr Zammit at bedside. 

## 2020-01-13 NOTE — ED Triage Notes (Signed)
Patient stated he had a foot R cramp last Wednesday that has progressively gotten worse, now posterior knee, calf and thigh pain, endorses some swelling. Able to walk with pain. Denies any trauma or injury.

## 2020-01-13 NOTE — ED Notes (Signed)
Ultrasound at bedside

## 2020-01-13 NOTE — ED Provider Notes (Signed)
[facility] DEPT Provider Note   CSN: [id number] Arrival date & time: 01/13/20  1535     History Chief Complaint  Patient presents with  . Leg Pain    Andrew Campos is a 59 y.o. male.  Patient has right calf pain.  Is been hurting for a few days no history of trauma  The history is provided by the patient. No language interpreter was used.  Leg Pain Lower extremity pain location: Right calf. Pain details:    Quality:  Aching   Radiates to:  Does not radiate   Severity:  Moderate   Onset quality:  Sudden   Timing:  Constant   Progression:  Worsening Chronicity:  New Dislocation: no   Associated symptoms: no back pain and no fatigue        Past Medical History:  Diagnosis Date  . Back pain   . Hepatitis C    Hep C 1a genotype  . Meniere's disease     Patient Active Problem List   Diagnosis Date Noted  . Hepatitis C   . Chest pain, atypical 07/31/2012  . COPD exacerbation ([place]) 07/31/2012  . Tobacco abuse 07/31/2012  . Meniere's disease   . Back pain     Past Surgical History:  Procedure Laterality Date  . EYE SURGERY    . HERNIA REPAIR         No family history on file.  Social History   Tobacco Use  . Smoking status: Current Every Day Smoker  . Smokeless tobacco: Never Used  Substance Use Topics  . Alcohol use: Yes    Comment: rarely  . Drug use: No    Home Medications Prior to Admission medications   Medication Sig Start Date End Date Taking? Authorizing Provider  albuterol (PROVENTIL HFA;VENTOLIN HFA) 108 (90 Base) MCG/ACT inhaler Inhale 2 puffs into the lungs every 6 (six) hours as needed for wheezing or shortness of breath. 01/29/17  Yes Dixon, Mary B, PA-C  levofloxacin (LEVAQUIN) 750 MG tablet Take 1 tablet (750 mg total) by mouth daily. Patient not taking: Reported on 01/13/2020 02/05/17   Orlena Sheldon, PA-C  predniSONE (DELTASONE) 20 MG tablet Take 1 tablet (20 mg total) by mouth daily with  breakfast. Patient not taking: Reported on 01/13/2020 01/29/17   Orlena Sheldon, PA-C  varenicline (CHANTIX STARTING MONTH PAK) 0.5 MG X 11 & 1 MG X 42 tablet Take one 0.5 mg tablet by mouth once daily for 3 days, then increase to one 0.5 mg tablet twice daily for 4 days, then increase to one 1 mg tablet twice daily. Patient not taking: Reported on 01/13/2020 01/29/17   Dena Billet B, PA-C    Allergies    Penicillins  Review of Systems   Review of Systems  Constitutional: Negative for appetite change and fatigue.  HENT: Negative for congestion, ear discharge and sinus pressure.   Eyes: Negative for discharge.  Respiratory: Negative for cough.   Cardiovascular: Negative for chest pain.  Gastrointestinal: Negative for abdominal pain and diarrhea.  Genitourinary: Negative for frequency and hematuria.  Musculoskeletal: Negative for back pain.       Right calf pain  Skin: Negative for rash.  Neurological: Negative for seizures and headaches.  Psychiatric/Behavioral: Negative for hallucinations.    Physical Exam Updated Vital Signs BP 132/80   Pulse 83   Temp 98.3 F (36.8 C) (Oral)   Resp 16   Ht 5\' 8"  (1.727 m)   Wt 68  kg   SpO2 96%   BMI 22.81 kg/m   Physical Exam Vitals and nursing note reviewed.  Constitutional:      Appearance: He is well-developed.  HENT:     Head: Normocephalic.     Mouth/Throat:     Mouth: Mucous membranes are moist.  Eyes:     Conjunctiva/sclera: Conjunctivae normal.  Neck:     Trachea: No tracheal deviation.  Cardiovascular:     Rate and Rhythm: Normal rate.     Heart sounds: No murmur.  Pulmonary:     Effort: Pulmonary effort is normal.  Abdominal:     General: Abdomen is flat.  Musculoskeletal:        General: Normal range of motion.     Cervical back: Normal range of motion.     Comments: Tenderness to right calf  Skin:    General: Skin is warm.  Neurological:     Mental Status: He is alert and oriented to person, place, and time.    Psychiatric:        Mood and Affect: Mood normal.     ED Results / Procedures / Treatments   Labs (all labs ordered are listed, but only abnormal results are displayed) Labs Reviewed  CBC WITH DIFFERENTIAL/PLATELET - Abnormal; Notable for the following components:      Result Value   MCH 25.9 (*)    All other components within normal limits  BASIC METABOLIC PANEL    EKG None  Radiology VAS [place] LOWER EXTREMITY VENOUS (DVT) (ONLY [facility] & [facility])  Result Date: 01/13/2020  Lower Venous DVTStudy Indications: Pain.  Comparison Study: No prior study Performing Technologist: Gertie Fey MHA, RDMS, RVT, RDCS  Examination Guidelines: A complete evaluation includes B-mode imaging, spectral Doppler, color Doppler, and power Doppler as needed of all accessible portions of each vessel. Bilateral testing is considered an integral part of a complete examination. Limited examinations for reoccurring indications may be performed as noted. The reflux portion of the exam is performed with the patient in reverse Trendelenburg.  +---------+---------------+---------+-----------+----------+--------------+ RIGHT    CompressibilityPhasicitySpontaneityPropertiesThrombus Aging +---------+---------------+---------+-----------+----------+--------------+ CFV      Full           Yes      Yes                                 +---------+---------------+---------+-----------+----------+--------------+ SFJ      Full                                                        +---------+---------------+---------+-----------+----------+--------------+ FV Prox  Full                                                        +---------+---------------+---------+-----------+----------+--------------+ FV Mid   Full                                                        +---------+---------------+---------+-----------+----------+--------------+  FV DistalFull                                                         +---------+---------------+---------+-----------+----------+--------------+ PFV      Full                                                        +---------+---------------+---------+-----------+----------+--------------+ POP      Full           Yes      Yes                                 +---------+---------------+---------+-----------+----------+--------------+ PTV      Full                                                        +---------+---------------+---------+-----------+----------+--------------+ PERO     Full                                                        +---------+---------------+---------+-----------+----------+--------------+     Summary: RIGHT: - There is no evidence of deep vein thrombosis in the lower extremity.  - No cystic structure found in the popliteal fossa.   *See table(s) above for measurements and observations.    Preliminary     Procedures Procedures (including critical care time)  Medications Ordered in ED Medications - No data to display  ED Course  I have reviewed the triage vital signs and the nursing notes.  Pertinent labs & imaging results that were available during my care of the patient were reviewed by me and considered in my medical decision making (see chart for details).    MDM Rules/Calculators/A&P                      Patient with right calf pain.  Ultrasound negative for DVT.  Labs on remarkable.  Patient with inflamed right calf and will follow up with his PCP if not improving Final Clinical Impression(s) / ED Diagnoses Final diagnoses:  None    Rx / DC Orders ED Discharge Orders    None       Bethann Berkshire, MD 01/14/20 1215

## 2020-01-13 NOTE — ED Provider Notes (Signed)
[facility] DEPT Provider Note   CSN: [id number] Arrival date & time: 01/13/20  1535     History Chief Complaint  Patient presents with  . Leg Pain    Andrew Campos is a 59 y.o. male.  Pt states he has been having pain in his right calf.  No history of any trauma  The history is provided by the patient. No language interpreter was used.  Leg Pain Lower extremity pain location: Right calf. Injury: no   Pain details:    Quality:  Aching   Radiates to:  Does not radiate   Severity:  Moderate   Onset quality:  Sudden   Timing:  Constant Associated symptoms: no back pain and no fatigue        Past Medical History:  Diagnosis Date  . Back pain   . Hepatitis C    Hep C 1a genotype  . Meniere's disease     Patient Active Problem List   Diagnosis Date Noted  . Hepatitis C   . Chest pain, atypical 07/31/2012  . COPD exacerbation ([place]) 07/31/2012  . Tobacco abuse 07/31/2012  . Meniere's disease   . Back pain     Past Surgical History:  Procedure Laterality Date  . EYE SURGERY    . HERNIA REPAIR         No family history on file.  Social History   Tobacco Use  . Smoking status: Current Every Day Smoker  . Smokeless tobacco: Never Used  Substance Use Topics  . Alcohol use: Yes    Comment: rarely  . Drug use: No    Home Medications Prior to Admission medications   Medication Sig Start Date End Date Taking? Authorizing Provider  albuterol (PROVENTIL HFA;VENTOLIN HFA) 108 (90 Base) MCG/ACT inhaler Inhale 2 puffs into the lungs every 6 (six) hours as needed for wheezing or shortness of breath. 01/29/17  Yes Dixon, Mary B, PA-C  ibuprofen (ADVIL) 800 MG tablet Take 1 tablet (800 mg total) by mouth every 8 (eight) hours as needed for moderate pain. 01/13/20   Milton Ferguson, MD  levofloxacin (LEVAQUIN) 750 MG tablet Take 1 tablet (750 mg total) by mouth daily. Patient not taking: Reported on 01/13/2020 02/05/17   Orlena Sheldon,  PA-C  predniSONE (DELTASONE) 20 MG tablet Take 1 tablet (20 mg total) by mouth daily with breakfast. Patient not taking: Reported on 01/13/2020 01/29/17   Orlena Sheldon, PA-C  varenicline (CHANTIX STARTING MONTH PAK) 0.5 MG X 11 & 1 MG X 42 tablet Take one 0.5 mg tablet by mouth once daily for 3 days, then increase to one 0.5 mg tablet twice daily for 4 days, then increase to one 1 mg tablet twice daily. Patient not taking: Reported on 01/13/2020 01/29/17   Dena Billet B, PA-C    Allergies    Penicillins  Review of Systems   Review of Systems  Constitutional: Negative for appetite change and fatigue.  HENT: Negative for congestion, ear discharge and sinus pressure.   Eyes: Negative for discharge.  Respiratory: Negative for cough.   Cardiovascular: Negative for chest pain.  Gastrointestinal: Negative for abdominal pain and diarrhea.  Genitourinary: Negative for frequency and hematuria.  Musculoskeletal: Negative for back pain.       Pain in right calf  Skin: Negative for rash.  Neurological: Negative for seizures and headaches.  Psychiatric/Behavioral: Negative for hallucinations.    Physical Exam Updated Vital Signs BP 132/80   Pulse 83  Temp 98.3 F (36.8 C) (Oral)   Resp 16   Ht 5\' 8"  (1.727 m)   Wt 68 kg   SpO2 96%   BMI 22.81 kg/m   Physical Exam Vitals and nursing note reviewed.  Constitutional:      Appearance: He is well-developed.  HENT:     Head: Normocephalic.     Nose: Nose normal.  Eyes:     Conjunctiva/sclera: Conjunctivae normal.  Neck:     Trachea: No tracheal deviation.  Cardiovascular:     Rate and Rhythm: Normal rate and regular rhythm.     Pulses: Normal pulses.  Pulmonary:     Effort: Pulmonary effort is normal.  Musculoskeletal:        General: Normal range of motion.     Cervical back: Normal range of motion.     Comments: Mild tenderness right calf  Skin:    General: Skin is warm.  Neurological:     Mental Status: He is oriented to  person, place, and time.     ED Results / Procedures / Treatments   Labs (all labs ordered are listed, but only abnormal results are displayed) Labs Reviewed  CBC WITH DIFFERENTIAL/PLATELET - Abnormal; Notable for the following components:      Result Value   MCH 25.9 (*)    All other components within normal limits  BASIC METABOLIC PANEL    EKG None  Radiology VAS LOWER EXTREMITY VENOUS (DVT) (ONLY [facility] & [facility])  Result Date: 01/13/2020  Lower Venous DVTStudy Indications: Pain.  Comparison Study: No prior study Performing Technologist: 03/14/2020 MHA, RDMS, RVT, RDCS  Examination Guidelines: A complete evaluation includes B-mode imaging, spectral Doppler, color Doppler, and power Doppler as needed of all accessible portions of each vessel. Bilateral testing is considered an integral part of a complete examination. Limited examinations for reoccurring indications may be performed as noted. The reflux portion of the exam is performed with the patient in reverse Trendelenburg.  +---------+---------------+---------+-----------+----------+--------------+ RIGHT    CompressibilityPhasicitySpontaneityPropertiesThrombus Aging +---------+---------------+---------+-----------+----------+--------------+ CFV      Full           Yes      Yes                                 +---------+---------------+---------+-----------+----------+--------------+ SFJ      Full                                                        +---------+---------------+---------+-----------+----------+--------------+ FV Prox  Full                                                        +---------+---------------+---------+-----------+----------+--------------+ FV Mid   Full                                                        +---------+---------------+---------+-----------+----------+--------------+ FV DistalFull                                                         +---------+---------------+---------+-----------+----------+--------------+  PFV      Full                                                        +---------+---------------+---------+-----------+----------+--------------+ POP      Full           Yes      Yes                                 +---------+---------------+---------+-----------+----------+--------------+ PTV      Full                                                        +---------+---------------+---------+-----------+----------+--------------+ PERO     Full                                                        +---------+---------------+---------+-----------+----------+--------------+     Summary: RIGHT: - There is no evidence of deep vein thrombosis in the lower extremity.  - No cystic structure found in the popliteal fossa.   *See table(s) above for measurements and observations.    Preliminary     Procedures Procedures (including critical care time)  Medications Ordered in ED Medications - No data to display  ED Course  I have reviewed the triage vital signs and the nursing notes.  Pertinent labs & imaging results that were available during my care of the patient were reviewed by me and considered in my medical decision making (see chart for details).    MDM Rules/Calculators/A&P                      Ultrasound negative for DVT.  Labs unremarkable.  Patient given Motrin to help with sore distal right calf Final Clinical Impression(s) / ED Diagnoses Final diagnoses:  Right calf pain    Rx / DC Orders ED Discharge Orders         Ordered    ibuprofen (ADVIL) 800 MG tablet  Every 8 hours PRN     01/13/20 1823           Bethann Berkshire, MD 01/13/20 1827

## 2023-06-27 ENCOUNTER — Emergency Department (HOSPITAL_COMMUNITY)
Admission: EM | Admit: 2023-06-27 | Discharge: 2023-06-27 | Disposition: A | Discharge status: Home / Self Care | Payer: Worker's Compensation | Attending: Emergency Medicine | Admitting: Emergency Medicine

## 2023-06-27 ENCOUNTER — Other Ambulatory Visit: Payer: Self-pay

## 2023-06-27 ENCOUNTER — Other Ambulatory Visit (HOSPITAL_COMMUNITY): Payer: Self-pay

## 2023-06-27 ENCOUNTER — Encounter (HOSPITAL_COMMUNITY): Payer: Self-pay | Admitting: Emergency Medicine

## 2023-06-27 DIAGNOSIS — M5442 Lumbago with sciatica, left side: Secondary | ICD-10-CM | POA: Diagnosis not present

## 2023-06-27 DIAGNOSIS — M545 Low back pain, unspecified: Secondary | ICD-10-CM | POA: Diagnosis present

## 2023-06-27 MED ORDER — LIDOCAINE 5 % EX PTCH
1.0000 | MEDICATED_PATCH | CUTANEOUS | 0 refills | Status: AC
  Filled 2023-06-27: qty 30, 30d supply, fill #0

## 2023-06-27 MED ORDER — METHOCARBAMOL 500 MG PO TABS
500.0000 mg | ORAL_TABLET | Freq: Two times a day (BID) | ORAL | 0 refills | Status: AC
  Filled 2023-06-27: qty 20, 10d supply, fill #0

## 2023-06-27 MED ORDER — METHYLPREDNISOLONE 4 MG PO TBPK
ORAL_TABLET | ORAL | 0 refills | Status: AC
  Filled 2023-06-27: qty 21, 6d supply, fill #0

## 2023-06-27 NOTE — Discharge Instructions (Addendum)
As we discussed, your back pain is likely a flare of your chronic back pain sustained from overuse at work last week.   There is been a lot of research on back pain, unfortunately the only thing that seems to really help is Tylenol and ibuprofen.  Relative rest is also important to not lift greater than 10 pounds bending or twisting at the waist.  Please follow-up with your family physician.  The other thing that really seems to benefit patients is physical therapy which your doctor may send you for.  Please return to the emergency department for new numbness or weakness to your arms or legs. Difficulty with urinating or urinating or pooping on yourself.  Also if you cannot feel toilet paper when you wipe or get a fever.   Take 4 over the counter ibuprofen tablets 3 times a day or 2 over-the-counter naproxen tablets twice a day for pain. Also take tylenol 1000mg (2 extra strength) four times a day.   I have also given you a prescription for a medrol dosepak, robaxin, and lidocaine patches for you to take as well for symptomatic relief. Do not drive or operate heavy machinery while taking this medication as it can be sedating.   Please call your orthopedist at your earliest convenience to schedule an appointment for follow-up.  Return if development of any new or worsening symptoms.

## 2023-06-27 NOTE — ED Triage Notes (Signed)
While at work, the patient re-injured his back while at work on this past Wednesday. He describes the pain as burning pain that starts in the back and radiates into his left foot. Patient feels as though he constantly has to have a bowel movement, but has had regular bowel movements.

## 2023-06-27 NOTE — ED Provider Notes (Signed)
[place] EMERGENCY DEPARTMENT AT [facility] Provider Note   CSN: [id number] Arrival date & time: 06/27/23  1529     History  Chief Complaint  Patient presents with   Back Pain    Andrew Campos is a 62 y.o. male.  Patient presents today with complaints of back pain. He states that he has had low back pain for the past few years and has gone through physical therapy with [facility]. He had a lumbar spine MRI last fall that he reports showed a herniated disc on the left side.  He has not had any surgeries on his back.  States that he was just recently cleared by [facility] to return to work at his job on [employer]. States last week he was doing heavy lifting and pushing and pulling heavy materials and overdid it. On Wednesday he notes that he back was feeling similar to when he had his original injury a few years ago. Notes pain radiating down his left leg and into his foot. He is able to walk with some discomfort, denies any weakness in the leg. Denies any numbness in his leg. No loss of bowel or bladder function or saddle paraesthesias. Does note that he has been having more bowel movements than usual which is abnormal to him. Denies fevers or chills, no history of IVDU or malignancy. He went to [facility] and was sent here for evaluation.    The history is provided by the patient. No language interpreter was used.  Back Pain      Home Medications Prior to Admission medications   Medication Sig Start Date End Date Taking? Authorizing Provider  albuterol (PROVENTIL HFA;VENTOLIN HFA) 108 (90 Base) MCG/ACT inhaler Inhale 2 puffs into the lungs every 6 (six) hours as needed for wheezing or shortness of breath. 01/29/17   Dorena Bodo, PA-C  ibuprofen (ADVIL) 800 MG tablet Take 1 tablet (800 mg total) by mouth every 8 (eight) hours as needed for moderate pain. 01/13/20   Bethann Berkshire, MD  levofloxacin (LEVAQUIN) 750 MG tablet Take 1 tablet (750 mg total) by  mouth daily. Patient not taking: Reported on 01/13/2020 02/05/17   Dorena Bodo, PA-C  predniSONE (DELTASONE) 20 MG tablet Take 1 tablet (20 mg total) by mouth daily with breakfast. Patient not taking: Reported on 01/13/2020 01/29/17   Dorena Bodo, PA-C  varenicline (CHANTIX STARTING MONTH PAK) 0.5 MG X 11 & 1 MG X 42 tablet Take one 0.5 mg tablet by mouth once daily for 3 days, then increase to one 0.5 mg tablet twice daily for 4 days, then increase to one 1 mg tablet twice daily. Patient not taking: Reported on 01/13/2020 01/29/17   Dorena Bodo, PA-C      Allergies    Penicillins    Review of Systems   Review of Systems  Musculoskeletal:  Positive for back pain.  All other systems reviewed and are negative.   Physical Exam Updated Vital Signs BP (!) 145/98 (BP Location: Right Arm)   Pulse 82   Temp 98.3 F (36.8 C) (Oral)   Resp 18   SpO2 97%  Physical Exam Vitals and nursing note reviewed.  Constitutional:      General: He is not in acute distress.    Appearance: Normal appearance. He is normal weight. He is not ill-appearing, toxic-appearing or diaphoretic.  HENT:     Head: Normocephalic and atraumatic.  Cardiovascular:     Rate and  Rhythm: Normal rate.  Pulmonary:     Effort: Pulmonary effort is normal. No respiratory distress.  Abdominal:     General: Abdomen is flat.     Palpations: Abdomen is soft.     Tenderness: There is no abdominal tenderness. There is no right CVA tenderness or left CVA tenderness.  Musculoskeletal:        General: Normal range of motion.     Cervical back: Normal range of motion.     Right lower leg: No edema.     Left lower leg: No edema.     Comments: No midline tenderness to palpation of thoracic or lumbar spine. Patient notes the pain is in his left buttock, however unable to reproduce his pain. No overlying skin changes, stepoffs, lesions, or deformity.  Patient observed to be ambulatory with a steady gait. 5/5 strength and sensation  present in bilateral lower extremities. DP and PT pulses intact and 2+. +SLR on the left.   Skin:    General: Skin is warm and dry.  Neurological:     General: No focal deficit present.     Mental Status: He is alert.  Psychiatric:        Mood and Affect: Mood normal.        Behavior: Behavior normal.     ED Results / Procedures / Treatments   Labs (all labs ordered are listed, but only abnormal results are displayed) Labs Reviewed - No data to display  EKG None  Radiology No results found.  Procedures Procedures    Medications Ordered in ED Medications - No data to display  ED Course/ Medical Decision Making/ A&P                                 Medical Decision Making  Patient presents today with complaints of left sided low back pain. He is afebrile, non-toxic appearing, and in no acute distress with reassuring vital signs. Physical exam reveals, unable to reproduce patients pain with direct palpation. He is able to ambulate with steady gait. Good distal pulses, strength, and sensation bilaterally.  Does have positive straight leg raise on the left. He does note that he has had more bowel movements over the past few days since his pain increased, however denies any loss of bowel or bladder function or saddle paraesthesias. He has no neurologic deficits or signs/symptoms to suggest cauda equina. No fever, night sweats, weight loss, h/o cancer, IVDU. Discussed same with patient who would prefer to see his orthopedist outpatient at the appointment he has already scheduled next week. He has tried narcotics previously and did not feel this gave him any benefit. Will try medrol dosepak and Robaxin with lidocaine patches as well as RICE protocol indicated and discussed with patient. Evaluation and diagnostic testing in the emergency department does not suggest an emergent condition requiring admission or immediate intervention beyond what has been performed at this time.  Plan for  discharge with close PCP follow-up.  Patient is understanding and amenable with plan, educated on red flag symptoms that would prompt immediate return.  Patient discharged in stable condition.   Final Clinical Impression(s) / ED Diagnoses Final diagnoses:  Acute left-sided low back pain with left-sided sciatica    Rx / DC Orders ED Discharge Orders          Ordered    methylPREDNISolone (MEDROL DOSEPAK) 4 MG TBPK tablet  06/27/23 1611    methocarbamol (ROBAXIN) 500 MG tablet  2 times daily        06/27/23 1611    lidocaine (LIDODERM) 5 %  Every 24 hours        06/27/23 1614          An After Visit Summary was printed and given to the patient.     Vear Clock 06/27/23 1642    Pricilla Loveless, MD 07/03/23 0700

## 2023-08-29 ENCOUNTER — Other Ambulatory Visit (HOSPITAL_COMMUNITY): Payer: Self-pay

## 2023-09-26 ENCOUNTER — Other Ambulatory Visit (HOSPITAL_COMMUNITY): Payer: Self-pay
